# Patient Record
Sex: Female | Born: 1990 | Race: White | Hispanic: No | Marital: Married | Smoking: Never smoker
Health system: Southern US, Community
[De-identification: ages and names within clinical notes are randomized; demographics above are authoritative.]

## PROBLEM LIST (undated history)

## (undated) DIAGNOSIS — Q796 Ehlers-Danlos syndrome, unspecified: Secondary | ICD-10-CM

## (undated) DIAGNOSIS — F988 Other specified behavioral and emotional disorders with onset usually occurring in childhood and adolescence: Secondary | ICD-10-CM

## (undated) DIAGNOSIS — J45909 Unspecified asthma, uncomplicated: Secondary | ICD-10-CM

## (undated) DIAGNOSIS — Z8679 Personal history of other diseases of the circulatory system: Secondary | ICD-10-CM

## (undated) HISTORY — DX: Other specified behavioral and emotional disorders with onset usually occurring in childhood and adolescence: F98.8

## (undated) HISTORY — PX: HAND SURGERY: SHX662

## (undated) HISTORY — PX: WISDOM TOOTH EXTRACTION: SHX21

## (undated) HISTORY — DX: Personal history of other diseases of the circulatory system: Z86.79

## (undated) HISTORY — PX: WRIST SURGERY: SHX841

## (undated) HISTORY — DX: Unspecified asthma, uncomplicated: J45.909

## (undated) HISTORY — DX: Ehlers-Danlos syndrome, unspecified: Q79.60

---

## 2016-08-28 ENCOUNTER — Encounter: Payer: Self-pay | Admitting: Emergency Medicine

## 2016-08-28 ENCOUNTER — Emergency Department
Admission: EM | Admit: 2016-08-28 | Discharge: 2016-08-28 | Disposition: A | Discharge status: Home / Self Care | Payer: Managed Care, Other (non HMO) | Attending: Student in an Organized Health Care Education/Training Program | Admitting: Student in an Organized Health Care Education/Training Program

## 2016-08-28 DIAGNOSIS — W260XXA Contact with knife, initial encounter: Secondary | ICD-10-CM | POA: Diagnosis not present

## 2016-08-28 DIAGNOSIS — Y9389 Activity, other specified: Secondary | ICD-10-CM | POA: Insufficient documentation

## 2016-08-28 DIAGNOSIS — Y929 Unspecified place or not applicable: Secondary | ICD-10-CM | POA: Diagnosis not present

## 2016-08-28 DIAGNOSIS — S61011A Laceration without foreign body of right thumb without damage to nail, initial encounter: Secondary | ICD-10-CM

## 2016-08-28 DIAGNOSIS — Y999 Unspecified external cause status: Secondary | ICD-10-CM | POA: Diagnosis not present

## 2016-08-28 MED ORDER — LIDOCAINE HCL (PF) 1 % IJ SOLN
INTRAMUSCULAR | Status: AC
  Administered 2016-08-28: 5 mL
  Filled 2016-08-28: qty 5

## 2016-08-28 MED ORDER — LIDOCAINE HCL (PF) 1 % IJ SOLN
5.0000 mL | Freq: Once | INTRAMUSCULAR | Status: AC
  Administered 2016-08-28: 5 mL

## 2016-08-28 NOTE — ED Notes (Signed)

## 2016-08-28 NOTE — Discharge Instructions (Signed)
Please keep the laceration site clean. Do not submerge underwater. You may shower and get wet. You may also cleansed with half strength hydrogen peroxide daily. Avoid applying antibiotic ointment until after the sutures have been removed. Follow-up with walking clinic, PCP in 8-10 days for suture removal. If any redness warmth or drainage, return to the ER or walk-in clinic.

## 2016-08-28 NOTE — ED Triage Notes (Signed)
Pt states that she was cutting an onion this evening and cut her right thumb. Pt has an approximate 2 inch cut to the pad of the thumb. Pt is ambulatory to triage with NAD noted at this time.

## 2016-08-28 NOTE — ED Provider Notes (Signed)
[facility]-EMERGENCY DEPARTMENT Provider Note   CSN: [id number] Arrival date & time: 08/28/16  2050     History   Chief Complaint Chief Complaint  Patient presents with  . Extremity Laceration    HPI Heather Holden is a 26 y.o. female presents to the emergency department for evaluation of right thumb laceration. Patient suffered a laceration to the right thumb just prior to arrival. She cut her thumb with a kitchen knife that she was cutting onions. Her tetanus is up-to-date. She denies any numbness or tingling. Bleeding has been moderate but is currently under control. Her pain is currently moderate.  HPI  History reviewed. No pertinent past medical history.  There are no active problems to display for this patient.   Past Surgical History:  Procedure Laterality Date  . WISDOM TOOTH EXTRACTION      OB History    No data available       Home Medications    Prior to Admission medications   Not on File    Family History No family history on file.  Social History Social History  Substance Use Topics  . Smoking status: Never Smoker  . Smokeless tobacco: Never Used  . Alcohol use 1.2 oz/week    2 Glasses of wine per week     Allergies   Patient has no known allergies.   Review of Systems Review of Systems  Constitutional: Negative for activity change, chills, fatigue and fever.  HENT: Negative for congestion, sinus pressure and sore throat.   Eyes: Negative for visual disturbance.  Respiratory: Negative for cough, chest tightness and shortness of breath.   Cardiovascular: Negative for chest pain and leg swelling.  Gastrointestinal: Negative for abdominal pain, diarrhea, nausea and vomiting.  Genitourinary: Negative for dysuria.  Musculoskeletal: Negative for arthralgias and gait problem.  Skin: Positive for wound. Negative for rash.  Neurological: Negative for weakness, numbness and headaches.  Hematological: Negative for adenopathy.    Psychiatric/Behavioral: Negative for agitation, behavioral problems and confusion.     Physical Exam Updated Vital Signs BP 123/67   Pulse 75   Temp 99.3 F (37.4 C) (Oral)   Resp 18   Ht 5\' 9"  (1.753 m)   Wt 69.4 kg   LMP 08/14/2016 (Approximate)   SpO2 100%   BMI 22.59 kg/m   Physical Exam  Constitutional: She appears well-developed and well-nourished. No distress.  HENT:  Head: Normocephalic and atraumatic.  Eyes: Conjunctivae are normal.  Neck: Normal range of motion.  Cardiovascular: Normal rate.   Pulmonary/Chest: Effort normal. No respiratory distress.  Musculoskeletal:  Examination of the right thumb shows patient is able to maintain extension along the interphalangeal joint. She has full flexion. She has a volar laceration along the distal phalanx of the right thumb extending proximal to distal along the pad. There is no injury to the nail. No sign of foreign body. Sensation is intact.  Neurological: She is alert.  Skin: Skin is warm and dry.  Psychiatric: She has a normal mood and affect.  Nursing note and vitals reviewed.    ED Treatments / Results  Labs (all labs ordered are listed, but only abnormal results are displayed) Labs Reviewed - No data to display  EKG  EKG Interpretation None       Radiology No results found.  Procedures Procedures (including critical care time) LACERATION REPAIR Performed by: Patience MuscaGAINES, Aidin Doane CHRISTOPHER Authorized by: Patience MuscaGAINES, Deshonna Trnka CHRISTOPHER Consent: Verbal consent obtained. Risks and benefits: risks, benefits and alternatives were discussed Consent  given by: patient Patient identity confirmed: provided demographic data Prepped and Draped in normal sterile fashion Wound explored  Laceration Location: Right thumb  Laceration Length: 4 cm  No Foreign Bodies seen or palpated  Anesthesia: local infiltration  Local anesthetic: lidocaine 1 % without epinephrine  Anesthetic total: 3 ml  Irrigation method:  syringe Amount of cleaning: standard  Skin closure: Simple interrupted 5-0 nylon   Number of sutures: 7   Technique: Simple interrupted   Patient tolerance: Patient tolerated the procedure well with no immediate complications.  Medications Ordered in ED Medications  lidocaine (PF) (XYLOCAINE) 1 % injection 5 mL (5 mLs Infiltration Given by Other 08/28/16 2115)     Initial Impression / Assessment and Plan / ED Course  I have reviewed the triage vital signs and the nursing notes.  Pertinent labs & imaging results that were available during my care of the patient were reviewed by me and considered in my medical decision making (see chart for details).     26 year old female with laceration to the right thumb, linear with no neurological or tendon deficits noted. Wound was thoroughly irrigated and repaired with sutures. Patient will follow-up in 8-10 days for suture removal.  Final Clinical Impressions(s) / ED Diagnoses   Final diagnoses:  Laceration of right thumb without foreign body without damage to nail, initial encounter    New Prescriptions There are no discharge medications for this patient.    Evon Slack, PA-C 08/28/16 2157    Willy Eddy, MD 08/28/16 [phone]

## 2019-02-28 DIAGNOSIS — Z20828 Contact with and (suspected) exposure to other viral communicable diseases: Secondary | ICD-10-CM | POA: Diagnosis not present

## 2019-05-16 DIAGNOSIS — Z6823 Body mass index (BMI) 23.0-23.9, adult: Secondary | ICD-10-CM | POA: Diagnosis not present

## 2019-05-16 DIAGNOSIS — H11152 Pinguecula, left eye: Secondary | ICD-10-CM | POA: Diagnosis not present

## 2019-05-17 DIAGNOSIS — H15122 Nodular episcleritis, left eye: Secondary | ICD-10-CM | POA: Diagnosis not present

## 2019-05-24 DIAGNOSIS — H15122 Nodular episcleritis, left eye: Secondary | ICD-10-CM | POA: Diagnosis not present

## 2019-07-13 NOTE — L&D Delivery Note (Signed)
Delivery Note At 9:27 AM a viable female was delivered via Vaginal, Spontaneous (Presentation: Left Occiput Anterior).  APGAR: 8, 9; weight  .   Placenta status: Manual removal, Intact.  Cord: 3 vessels with the following complications: Long cord.  Anesthesia: Epidural Episiotomy: None Lacerations: 1st degree;Periurethral Suture Repair: vicryl Est. Blood Loss (mL): 300  Mom to postpartum.  Baby to Couplet care / Skin to Skin.  Lyn Henri 03/31/2020, 10:22 AM

## 2019-07-13 NOTE — L&D Delivery Note (Signed)
Vaginal Delivery Note  Progressed to complete. Pushed to delivery a viable female infant in LOA presentation. Nuchal x 2 noted and reduced mid delivery. Apgars and weight pending. Placenta intact. no complications. 1st degree perineal laceration repaired in the usual fashion as well as right periurethral tear repaired in normal fashion. No bleeding after.  Sponge, needle and instrument count correct.  EBL 300 cc.  Patient tolerated well.   Nilda Simmer MD

## 2019-09-12 DIAGNOSIS — N911 Secondary amenorrhea: Secondary | ICD-10-CM | POA: Diagnosis not present

## 2019-09-12 DIAGNOSIS — Z0183 Encounter for blood typing: Secondary | ICD-10-CM | POA: Diagnosis not present

## 2019-09-12 DIAGNOSIS — N944 Primary dysmenorrhea: Secondary | ICD-10-CM | POA: Diagnosis not present

## 2019-09-12 DIAGNOSIS — Q796 Ehlers-Danlos syndrome, unspecified: Secondary | ICD-10-CM | POA: Diagnosis not present

## 2019-09-20 DIAGNOSIS — Z31430 Encounter of female for testing for genetic disease carrier status for procreative management: Secondary | ICD-10-CM | POA: Diagnosis not present

## 2019-09-20 DIAGNOSIS — Z3480 Encounter for supervision of other normal pregnancy, unspecified trimester: Secondary | ICD-10-CM | POA: Diagnosis not present

## 2019-09-20 DIAGNOSIS — Z3685 Encounter for antenatal screening for Streptococcus B: Secondary | ICD-10-CM | POA: Diagnosis not present

## 2019-09-20 DIAGNOSIS — Z3481 Encounter for supervision of other normal pregnancy, first trimester: Secondary | ICD-10-CM | POA: Diagnosis not present

## 2019-09-20 LAB — OB RESULTS CONSOLE HIV ANTIBODY (ROUTINE TESTING): HIV: NONREACTIVE

## 2019-09-20 LAB — OB RESULTS CONSOLE GC/CHLAMYDIA
Chlamydia: NEGATIVE
Gonorrhea: NEGATIVE

## 2019-09-20 LAB — OB RESULTS CONSOLE HEPATITIS B SURFACE ANTIGEN: Hepatitis B Surface Ag: NEGATIVE

## 2019-09-20 LAB — OB RESULTS CONSOLE RUBELLA ANTIBODY, IGM: Rubella: NON-IMMUNE/NOT IMMUNE

## 2019-10-02 DIAGNOSIS — Z3401 Encounter for supervision of normal first pregnancy, first trimester: Secondary | ICD-10-CM | POA: Diagnosis not present

## 2019-10-02 DIAGNOSIS — Q796 Ehlers-Danlos syndrome, unspecified: Secondary | ICD-10-CM | POA: Diagnosis not present

## 2019-10-02 DIAGNOSIS — Z331 Pregnant state, incidental: Secondary | ICD-10-CM | POA: Diagnosis not present

## 2019-10-02 DIAGNOSIS — Z124 Encounter for screening for malignant neoplasm of cervix: Secondary | ICD-10-CM | POA: Diagnosis not present

## 2019-10-02 DIAGNOSIS — Z113 Encounter for screening for infections with a predominantly sexual mode of transmission: Secondary | ICD-10-CM | POA: Diagnosis not present

## 2019-10-09 ENCOUNTER — Other Ambulatory Visit (HOSPITAL_COMMUNITY): Payer: Self-pay | Admitting: Obstetrics and Gynecology

## 2019-10-10 DIAGNOSIS — Z36 Encounter for antenatal screening for chromosomal anomalies: Secondary | ICD-10-CM | POA: Diagnosis not present

## 2019-10-10 DIAGNOSIS — Z3A12 12 weeks gestation of pregnancy: Secondary | ICD-10-CM | POA: Diagnosis not present

## 2019-10-10 DIAGNOSIS — Z3682 Encounter for antenatal screening for nuchal translucency: Secondary | ICD-10-CM | POA: Diagnosis not present

## 2019-10-18 DIAGNOSIS — R87611 Atypical squamous cells cannot exclude high grade squamous intraepithelial lesion on cytologic smear of cervix (ASC-H): Secondary | ICD-10-CM | POA: Diagnosis not present

## 2019-10-18 DIAGNOSIS — N871 Moderate cervical dysplasia: Secondary | ICD-10-CM | POA: Diagnosis not present

## 2019-10-29 ENCOUNTER — Encounter (HOSPITAL_COMMUNITY): Payer: Self-pay | Admitting: *Deleted

## 2019-10-30 ENCOUNTER — Ambulatory Visit (HOSPITAL_BASED_OUTPATIENT_CLINIC_OR_DEPARTMENT_OTHER): Payer: BC Managed Care – PPO | Admitting: Genetic Counselor

## 2019-10-30 ENCOUNTER — Other Ambulatory Visit: Payer: Self-pay

## 2019-10-30 ENCOUNTER — Ambulatory Visit (HOSPITAL_COMMUNITY): Payer: BC Managed Care – PPO | Attending: Obstetrics and Gynecology | Admitting: Obstetrics

## 2019-10-30 ENCOUNTER — Ambulatory Visit (HOSPITAL_COMMUNITY): Payer: BC Managed Care – PPO | Admitting: *Deleted

## 2019-10-30 ENCOUNTER — Encounter (HOSPITAL_COMMUNITY): Payer: Self-pay

## 2019-10-30 ENCOUNTER — Ambulatory Visit (HOSPITAL_COMMUNITY): Payer: Self-pay | Admitting: Genetic Counselor

## 2019-10-30 ENCOUNTER — Other Ambulatory Visit (HOSPITAL_COMMUNITY): Payer: Self-pay | Admitting: *Deleted

## 2019-10-30 VITALS — BP 112/69 | HR 55 | Temp 97.6°F | Wt 165.0 lb

## 2019-10-30 DIAGNOSIS — Z8269 Family history of other diseases of the musculoskeletal system and connective tissue: Secondary | ICD-10-CM | POA: Diagnosis not present

## 2019-10-30 DIAGNOSIS — Q7962 Hypermobile Ehlers-Danlos syndrome: Secondary | ICD-10-CM

## 2019-10-30 DIAGNOSIS — Z362 Encounter for other antenatal screening follow-up: Secondary | ICD-10-CM

## 2019-10-30 DIAGNOSIS — O358XX0 Maternal care for other (suspected) fetal abnormality and damage, not applicable or unspecified: Secondary | ICD-10-CM

## 2019-10-30 DIAGNOSIS — Z3A15 15 weeks gestation of pregnancy: Secondary | ICD-10-CM | POA: Diagnosis not present

## 2019-10-30 DIAGNOSIS — Z315 Encounter for genetic counseling: Secondary | ICD-10-CM

## 2019-10-30 DIAGNOSIS — K005 Hereditary disturbances in tooth structure, not elsewhere classified: Secondary | ICD-10-CM

## 2019-10-30 NOTE — Progress Notes (Signed)
10/30/2019  Heather Holden Dec 09, 1990 MRN: [id number] DOV: 10/30/2019  Heather Holden presented to the [facility] for Maternal Fetal Care for a genetics consultation regarding her family history of Ehlers-Danlos syndrome. Heather Holden came to her appointment alone due to COVID-19 visitor restrictions.   Indication for genetic counseling - Family history of Ehlers Danlos syndrome  Prenatal history  Heather Holden is a [id number], 29 y.o. female. Her current pregnancy has completed [redacted]w[redacted]d(Estimated Date of Delivery: 04/21/20).  Heather Holden exposure to environmental toxins or chemical agents. She denied the use of alcohol, tobacco or street drugs. She reported taking prenatal vitamins. She denied significant viral illnesses, fevers, and bleeding during the course of her pregnancy. Her medical and surgical histories were noncontributory.  Family History  A three generation pedigree was drafted and reviewed. The family history is remarkable for the following:  - Heather Holden is adopted, but recently reconnected with her biological mother and learned that her mother and two maternal half sisters have hypermobile Ehlers-Danlos syndrome (EDS). Her mother has an aortic aneurysm that is being carefully monitored and had postpartum hemorrhages following all three of her pregnancies. Heather Holden not entirely sure about which specific features of EDS her half sisters have. Heather Holden. DMcbryarherself has tall stature, hyperflexibility, and attention deficit disorder, but reported no other features of EDS. See Discussion section for more details.   - One of Heather Holden half sisters has autism in addition to EDS. Autism can be isolated, multifactorial, or part of a genetic syndrome; however, underlying genetic conditions account for less than 10% of autism diagnoses. Recurrence risk for first-degree relatives of individuals with idiopathic autism is estimated to be between 2-8%. Given that Heather Holden sister is a third-degree  relative to her fetus, the risk of recurrence for autism in the current fetus is likely not greatly elevated above the general population risk of ~1 in 515 or 1.5%.  The remaining family histories were reviewed and found to be noncontributory for birth defects, intellectual disability, recurrent pregnancy loss, and known genetic conditions.    The patient's ethnicity is [place] The father of the pregnancy's ethnicity is [place] Ashkenazi Jewish ancestry and consanguinity were denied. Pedigree will be scanned under Media.  Discussion  Heather Holden was referred for genetic counseling due to her family history of Ehlers Danlos syndrome (EDS). EDS is the term for a group of connective tissue disorders that affect the skin, bones, blood vessels, and other organs/tissues throughout the body. According to the 2017 international classification of EDS, there are 13 distinct subtypes of EDS, including classical EDS, classical-like EDS, cardiac-valvular EDS, vascular EDS, hypermobile EDS, arthrochalasia EDS, dermatosparaxis EDS, kyphoscoliotic EDS, brittle cornea syndrome, spondylodysplastic EDS, musculocontractural EDS, myopathic EDS, and periodontal EDS. EDS affects at least 1 in every 5000 individuals. The hypermobile and classical forms of the condition are most common.   Individuals with EDS often have certain features in common, such as joint hypermobility, hypotonia, delayed motor skills, joint dislocations, chronic pain, skin abnormalities, easy bruising, and abnormal scarring. Each subtype is associated with additional distinct features. Hypermobile EDS (hEDS) has a complex set of clinical diagnostic criteria. These criteria include joint hypermobility, signs of faulty connective tissue throughout the body (e.g. skin features, hernias, prolapses), a family history of the condition, and musculoskeletal problems (e.g. long-term pain, dislocations). There are many  associated symptoms that are not part of the formal criteria for hEDS, such as postural orthostatic tachycardia  syndrome (POTS), digestive disorders, pelvic and bladder dysfunction, and anxiety disorders. Many individuals do not fully meet diagnostic criteria for hEDS but experience hypermobility that interferes with their life in some way. After other possible conditions are excluded, a diagnosis of generalized hypermobility spectrum disorder may be made for these individuals.   EDS is associated with at least 20 different genes; however, there has not yet been a genetic cause identified for hEDS. Many forms of EDS, including hEDS, are inherited in an autosomal dominant fashion. Since Heather Holden mother reportedly has hEDS, this means that Heather Holden herself has a 1 in 2 (50%) chance of having the condition. Heather Holden has not had an evaluation by a medical geneticist for hEDS. Given her limited symptoms, it is unlikely that she would meet clinical diagnostic criteria for the condition. However, Heather Holden could be referred to an adult genetics clinic to definitively determine whether or not she meets hEDS clinical criteria. If Heather Holden were diagnosed with hEDS, she would havea 1 in 2 (50%)chance of having a child with hEDS. This would be true for each of her pregnancies. I offered Heather Holden. Meloy a referral to an adult genetics clinic for a clinical EDS work-up, which she declined at this time.  Heather Holden. Grzesiak mother had genetic testing performed via a connective tissue disorder panel from [facility] in 2017. This panel sequenced 32 genes associated with syndromic connective tissue disorders and/or familial thoracic aortic aneurysms and aortic dissections. Upon review of her genetic testing report (sent to me with her consent), Heather Holden. Rybacki mother had variants of uncertain significance (VUS) in four genes: NOTCH1, COL1A2, COL11A1, and SLC2A10. She did not carry any pathogenic variants in the connective  tissue/aortic aneurysm susceptibility genes analyzed. Heather Holden. Hernandez and I discussed these findings. A VUS indicates that there is not enough evidence to determine whether a change in a gene is a benign variant or if it contributes to causing a connective tissue disease. Since the time that Heather Holden. Rabold's mother underwent testing, several other genetic testing laboratories have reclassified the identified variants in COL1A2 and COL11A1 as benign or likely benign. Neither NOTCH1 nor SLC2A10 are known to be associated with EDS. Thus, Heather Holden. Martorana mother's genetic testing results likely do not explain her history of hEDS.   Heather Holden. Vondra and I reviewed screening she had for conditions other than EDS during the current pregnancy. She reportedly had negative first trimester screening for trisomies 55, 71, and 25. This testing report was not available for my review. Heather Holden. Blackston also had negative carrier screening for cystic fibrosis (CF) and spinal muscular atrophy (SMA) as well as a negative hemoglobin electrophoresis. This significantly decreases her chances of being a carrier for CF, SMA, and hemoglobinopathies and thus her chances of having a child affected by one of these conditions.  Heather Holden. Jollie was also counseled regarding diagnostic testing via amniocentesis. We discussed the technical aspects of the procedure and quoted up to a 1 in 500 (0.2%) risk for spontaneous pregnancy loss or other adverse pregnancy outcomes as a result of amniocentesis. Cultured cells from an amniocentesis sample allow for the visualization of a fetal karyotype, which can detect >99% of chromosomal aberrations. Chromosomal microarray can also be performed to identify smaller deletions or duplications of fetal chromosomal material. Amniocentesis would not be able to assess for hEDS in the fetus given that there is no known genetic cause for hEDS at this time. Additionally, testing the fetus for connective tissue disorders via a multigene panel would  likely be uninformative given that Heather Holden. Heiser has not had genetic testing for connective tissue disorders herself. After careful consideration, Heather Holden. Beeck declined amniocentesis at this time. She understands that amniocentesis is available at any point after 16 weeks of pregnancy and that she may opt to undergo the procedure at a later date should she change her mind.  Additional screening and diagnostic testing were declined today. She wanted to discuss the option of an adult genetics referral with her partner and family members and make her decision about pursuing a referral later. Given that it is unknown if Heather Holden. Reisner has hEDS, her pregnancy will be followed closely by Maternal Fetal Medicine based on her family history of the condition. Heather Holden. Oshea had a consultation with Dr. Annamaria Boots today following our genetic counseling session to discuss expectant management and recommendations for the remainder of her pregnancy in detail.  I counseled Heather Holden. Persaud regarding the above risks and available options.The approximate face-to-face time with the genetic counselor was 40 minutes.  In summary:  Reviewed family history concerns  Biological mother and two maternal half sisters have hypermobile Ehlers-Danlos syndrome (hEDS). Mother had genetic testing for connective tissue disorders which was uninformative  50% chance she could have hEDS, though she may not meet clinical diagnostic criteria given her limited symptoms  Offered referral to adult genetics which she declined at this time. She will contact me if interested in pursuing this after discussing with family  Reviewed screening that has previously been performed in pregnancy  Reportedly negative first trimester screening (results not available to me)  Negative carrier screening for CF, SMA, and hemoglobinopathies  Offered additional testing and screening  Declined amniocentesis  Pregnancy will continue to be followed by MFM given family history of  [place], Heather Holden, Counselling psychologist

## 2019-11-01 NOTE — Progress Notes (Signed)
MFM Note  Heather Holden was seen in consultation today due to a family history of the hypermobility type of Ehlers-Danlos syndrome.  She is a 29 year old gravida 2 para 0 with an EDC of April 21, 2020 who is currently at 15 weeks and 1 day. Heather Holden reports that she was adopted as an infant.  She found out when she was 52 or 29 years old that her biological mother and half-sisters were all diagnosed with the hypermobility type of Ehlers-Danlos syndrome (EDS).  Other than being extremely flexible, she denies any other signs or symptoms related to Ehlers-Danlos syndrome.  She has not had any genetic testing to confirm that she has the Ehlers-Danlos syndrome and has not received an official diagnosis of EDS based on the clinical criteria.  She denies having any cardiac issues related to EDS.  She reports that her biological mother experienced postpartum hemorrhages with all her deliveries.  The patient reports that she had a first trimester screening test for fetal aneuploidy in her current pregnancy that indicated a low risk.  Obstetrical history includes an elective termination of pregnancy.  Medical history-denies any significant past medical history  Surgical history includes wisdom teeth removal and orthopedic surgery on her left hand.  She did not experience any complications with anesthesia during these surgeries.  Current medications-prenatal vitamins.  The patient was advised that hypermobile Ehlers-Danlos syndrome is one of the most common types of EDS. Pregnancy is generally well-tolerated in women with the hypermobile type of EDS and most women have good outcomes.  The reported pregnancy related complications in women with the hypermobile  type of EDS include preterm labor, preterm premature rupture of membranes, poor wound healing, and postpartum hemorrhage.  She was reassured that each of these complications can be managed during pregnancy should she develop any of these  complications.  Due to the increased risk of preterm labor in women with the hypermobile type of EDS, we will follow her with serial cervical length measurements starting at between 16 to 17 weeks and we will continue these measurements until about 24 weeks.  She was advised that should progressive cervical shortening be noted, that a cervical cerclage may be indicated.    We will continue to follow her closely with you with serial ultrasound exams.  A cervical length measurement was scheduled for her at around 17 weeks and a fetal anatomy scan has been scheduled for her at around 20 weeks in our office.    Due to the potential for aortic aneurysm/dissection in women with Ehlers-Danlos syndrome, she should have a maternal echocardiogram performed with cardiology sometime in the second trimester to screen for any maternal cardiac conditions.  She was reassured that aortic aneurysms generally occur in women with the vascular type of EDS.  Therefore, her risk of having a cardiac condition related to EDS should be low.  As the duration of analgesia from local anesthetics can be significantly reduced in patients with the hypermobile type of EDS, she should have an anesthesia consult when she is admitted to Labor and Delivery.  As she reports that she did not have any complications related to anesthesia during her two prior surgeries, I do not anticipate that this will be major issue for her.  Due to the potential for postpartum hemorrhage in women with the hypermobile type of EDS, preparations should be for the management of hemorrhage after her delivery with the appropriate administration of uterotonic agents such as Pitocin and Cytotec.  The patient also had  a consultation today with our genetic counselor to discuss the genetic transmission of Ehlers-Danlos syndrome to her child.  Please refer to the genetic counselor's report for this information.  The patient was reassured that based on my past  experience with women who have the hypermobile type of EDS in pregnancy and the fact that it is not even certain if she has Ehlers-Danlos syndrome, I anticipate that she will have a good pregnancy outcome.  At the end of the consultation, the patient stated that all her questions had been answered to her complete satisfaction.    Thank you for referring this patient for Maternal-Fetal Medicine consultation.  A total of 40 minutes was spent counseling and coordinating the care for this patient.  Greater than 50% of the time was spent in direct face-to-face contact.  Recommendations:  Serial cervical length measurements to start at around 16 to 17 weeks   Detailed fetal anatomy scan at around 20 weeks  Maternal echocardiogram to assess for any cardiac lesions   Anesthesia consult when she is admitted for delivery  Appropriate uterotonic's should be given after delivery  Vaginal delivery may be attempted

## 2019-11-02 ENCOUNTER — Telehealth: Payer: Self-pay

## 2019-11-02 NOTE — Telephone Encounter (Signed)
NOTES ON FILE FROM ELISE LEGER [phone], SENT REFERRAL TO SCHEDULING °

## 2019-11-05 ENCOUNTER — Encounter (HOSPITAL_COMMUNITY): Payer: Self-pay | Admitting: Obstetrics and Gynecology

## 2019-11-05 ENCOUNTER — Other Ambulatory Visit (HOSPITAL_COMMUNITY): Payer: Self-pay | Admitting: Obstetrics and Gynecology

## 2019-11-15 ENCOUNTER — Ambulatory Visit (HOSPITAL_COMMUNITY): Payer: BC Managed Care – PPO | Attending: Obstetrics and Gynecology

## 2019-11-15 ENCOUNTER — Other Ambulatory Visit: Payer: Self-pay

## 2019-11-15 ENCOUNTER — Ambulatory Visit: Payer: BC Managed Care – PPO | Admitting: *Deleted

## 2019-11-15 VITALS — BP 116/60 | HR 62 | Temp 97.8°F

## 2019-11-15 DIAGNOSIS — Q7962 Hypermobile Ehlers-Danlos syndrome: Secondary | ICD-10-CM | POA: Diagnosis not present

## 2019-11-15 DIAGNOSIS — O322XX0 Maternal care for transverse and oblique lie, not applicable or unspecified: Secondary | ICD-10-CM

## 2019-11-15 DIAGNOSIS — O0990 Supervision of high risk pregnancy, unspecified, unspecified trimester: Secondary | ICD-10-CM

## 2019-11-15 DIAGNOSIS — Z3686 Encounter for antenatal screening for cervical length: Secondary | ICD-10-CM

## 2019-11-15 DIAGNOSIS — Z8489 Family history of other specified conditions: Secondary | ICD-10-CM

## 2019-11-15 DIAGNOSIS — Z3A17 17 weeks gestation of pregnancy: Secondary | ICD-10-CM

## 2019-11-15 IMAGING — US US MFM OB TRANSVAGINAL
1 series · 15 of 22 positions shown · non-contrast
Comparison: none

[Series 1: us mfm ob transvaginal · 22 acquisitions, 15 frames shown]
[im 1/22]
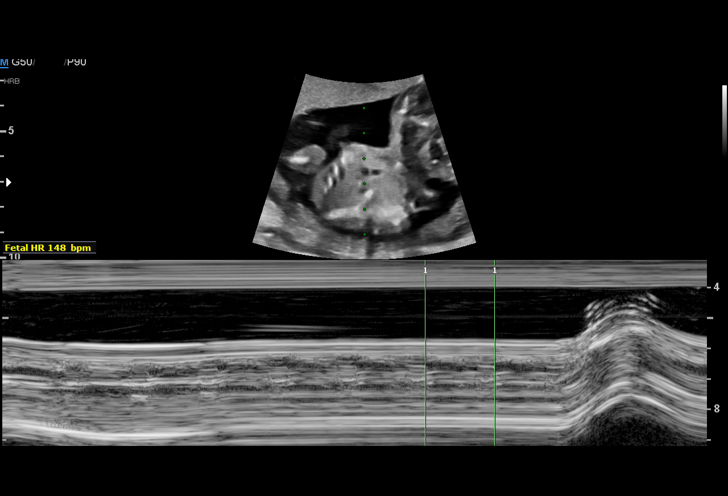
[im 3/22]
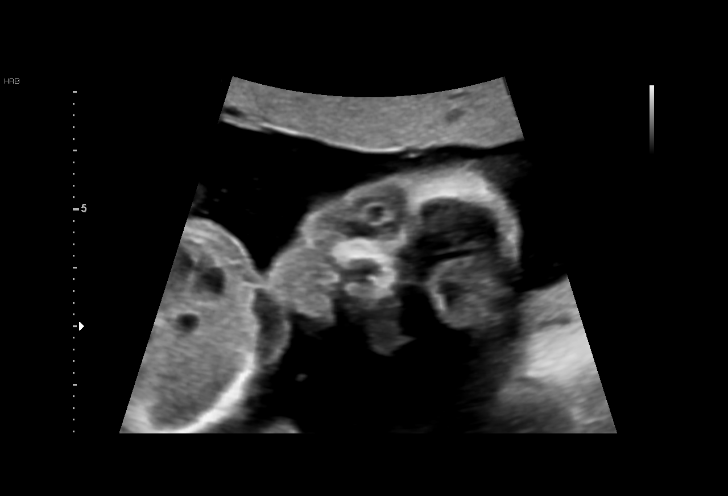
[im 4/22]
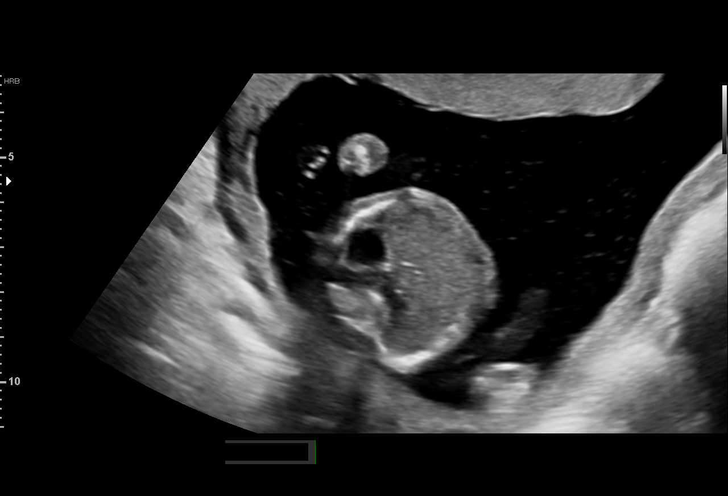
[im 6/22]
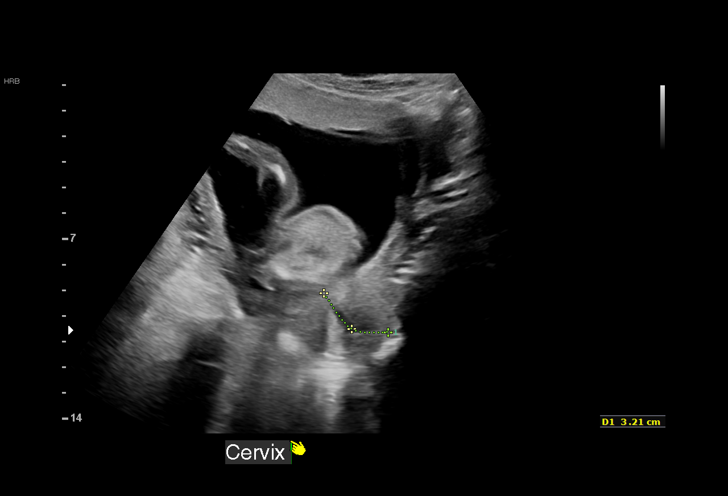
[im 7/22]
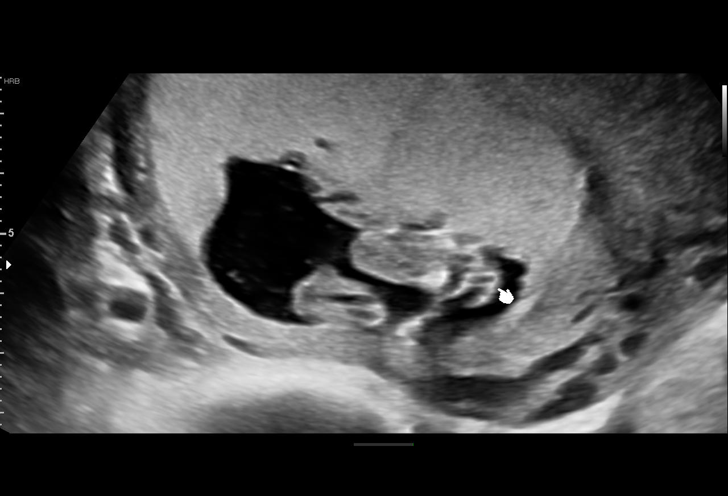
[im 9/22]
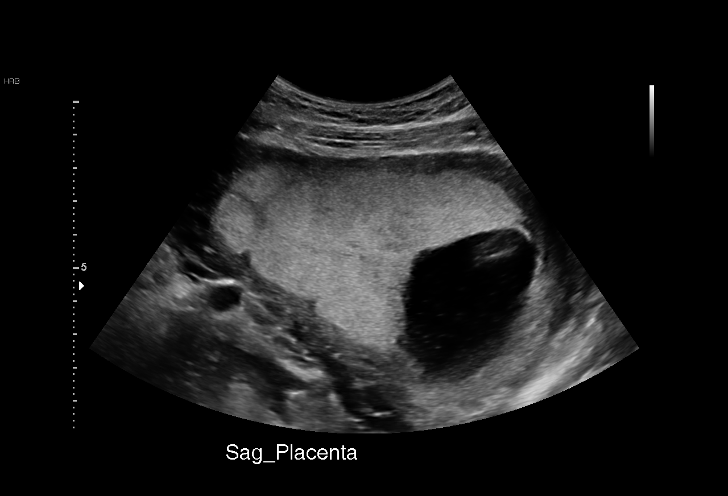
[im 10/22]
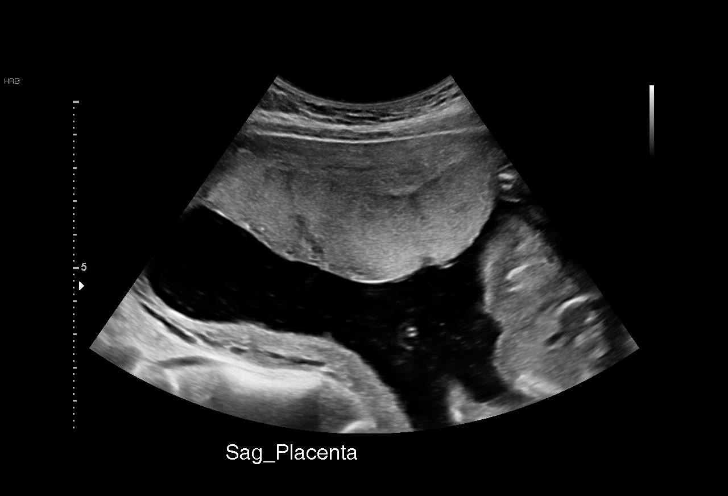
[im 12/22]
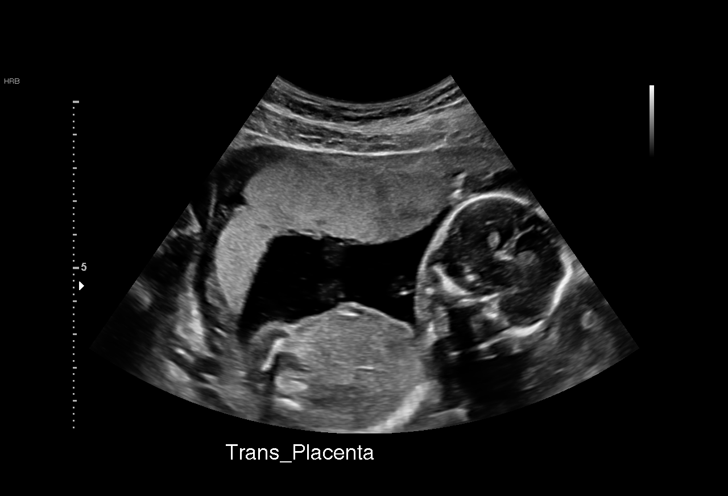
[im 13/22]
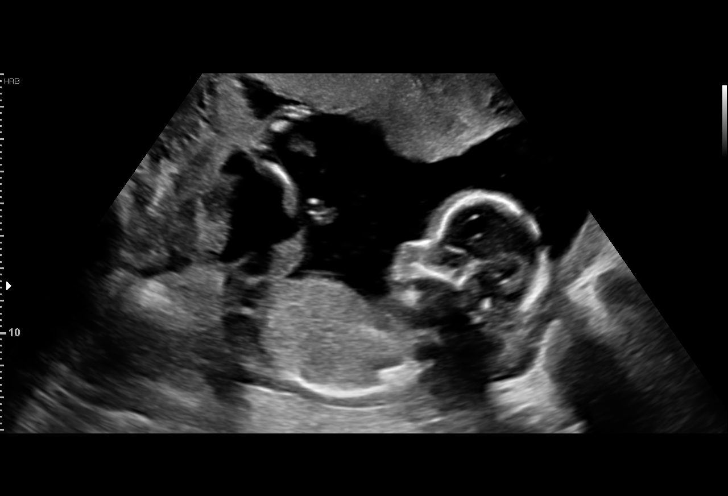
[im 14/22]
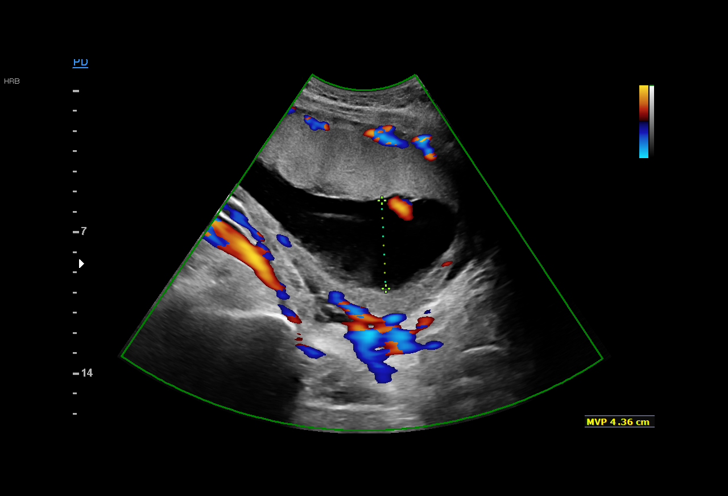
[im 16/22]
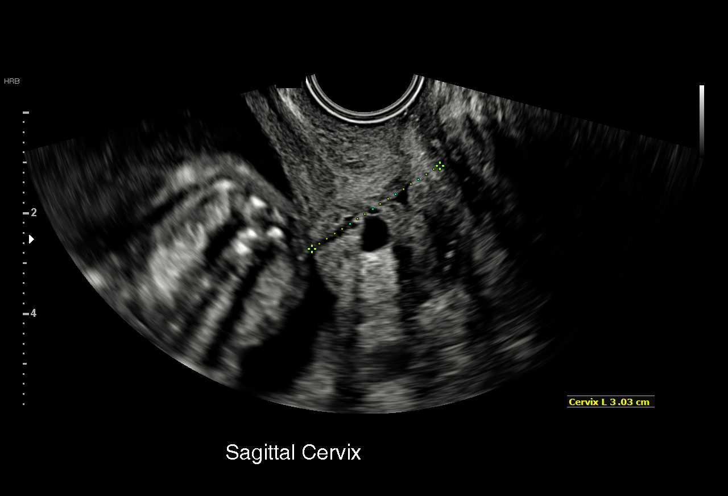
[im 17/22]
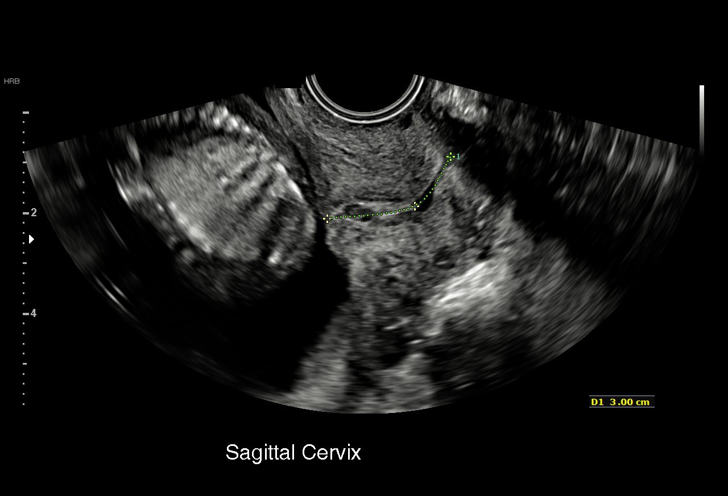
[im 19/22]
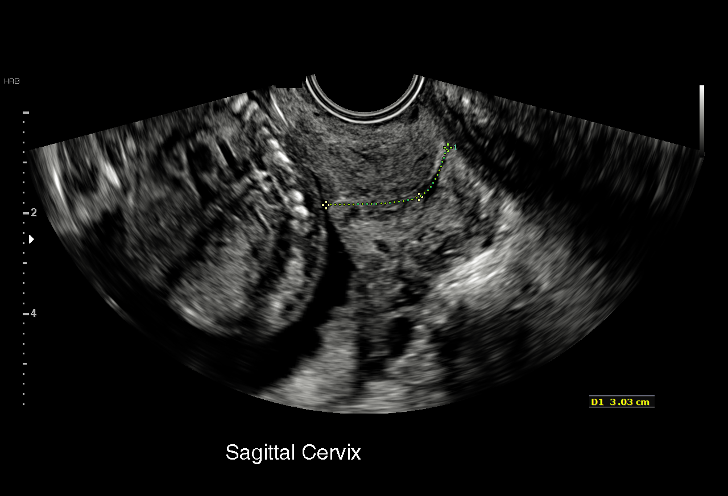
[im 20/22]
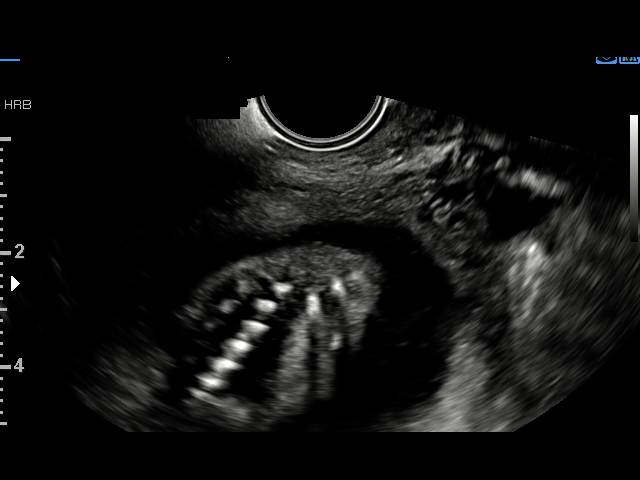
[im 22/22]
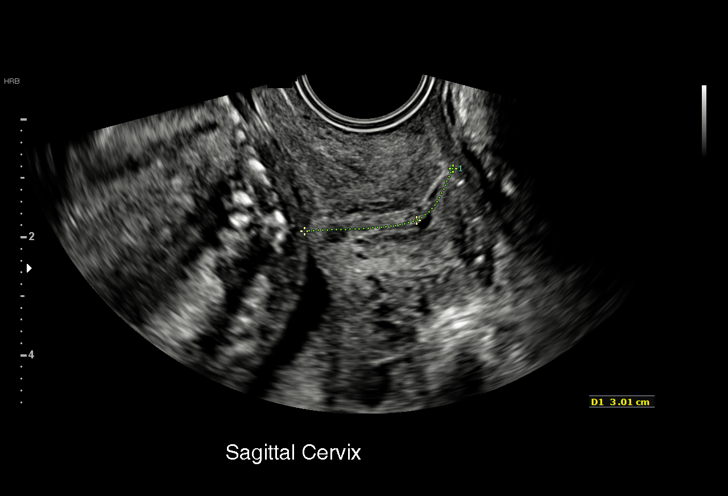

[15 of 22 positions shown; findings below may reference images not displayed]

Indications

 Family history of genetic disorder (Laurence
 Sidorov syndrome) pt beleives that she also
 has EDS but has not had any genetic testing to
 confirm
 Encounter for cervical length
 17 weeks gestation of pregnancy
 Low risk first trimester screen per patient - not
 in chart
Vital Signs

 BMI:
Fetal Evaluation

 Num Of Fetuses:          1
 Fetal Heart Rate(bpm):   148
 Cardiac Activity:        Observed
 Presentation:            Transverse, head to maternal left
 Placenta:                Anterior

 Amniotic Fluid
 AFI FV:      Within normal limits

                             Largest Pocket(cm)

OB History

 Gravidity:     2
 TOP:           1        Living: 0
Gestational Age

 LMP:            17w 3d       Date:  07/16/19                   EDD:  04/21/20
 Best:           17w 3d    Det. By:  LMP  (07/16/19)            EDD:  04/21/20
Anatomy

 Stomach:                Appears normal, left   Bladder:                Appears normal
                         sided
Cervix Uterus Adnexa

 Cervix
 Length:            3.03  cm.
 Normal appearance by transvaginal scan
Impression

 Limited exam to evaluate cervical length given Ms. Jerabkova
 diagnosis of Klpigbb [place]
 Normal cervical length
 Low risk NIIPS

 Ms. Delonte expressed that she plans to receive ongoing care at
 your offices. I discussed the recommendations as noted below.
Recommendations

 Continue serial CL every 2 weeks
 Anatomy at 18-20 weeks gestation.

 These studies have not been scheduled at our offices as Ms.
 Delonte plans to have them performed at your offices.

 Thank you for the referral.

## 2019-11-18 NOTE — Progress Notes (Signed)
Cardiology Office Note:   Date:  11/19/2019  NAME:  Heather Holden    MRN: [id number] DOB:  1990/09/18   PCP:  Tyson Dense, MD  Cardiologist:  No primary care provider on file.   Referring MD: Tyson Dense, *   Chief Complaint  Patient presents with  . Palpitations   History of Present Illness:   Heather Holden is a 29 y.o. female with a hx of pregnancy, Ehlers-Danlos/POTS who is being seen today for the evaluation of tachycardia at the request of Tyson Dense, *.  She presents for the evaluation of aortic aneurysm screening in setting of Ehlers-Danlos and pregnancy.  She is currently 18 weeks.  No issues with pregnancy so far.  She was diagnosed with Ehlers-Danlos hypermobile form some years ago.  Her mother has a history of a thoracic aortic aneurysm.  She is not had it repaired.  There is no history of sudden cardiac death or aortic dissection in the family from what I can tell.  She does not know the details of her mother's diagnosis.  She reports no symptoms such as chest pain, palpitations or shortness of breath.  She does work as a Loss adjuster, chartered.  She does work routinely still.  No issues with work.  She does report that she was diagnosed with POTS and treats this conservatively.  She stays well-hydrated but it appears prolonged standing or changing position as well as heat such as the shower will exacerbate her symptoms.  No recent symptoms.  She seems to be maintaining adequate hydration of pregnancy up to 96 ounces of water per day.  She is otherwise without symptoms.  She is a never smoker.  Surgical history is for minor traumas.  Family history is really only significant for thoracic aneurysm in mother.  Problem List 1. Ehlers-Danlos, hypermobile form  2. POTS   Past Medical History: Past Medical History:  Diagnosis Date  . ADD (attention deficit disorder)   . Asthma    Childhood  . Ehlers-Danlos disease   . History of cardiac arrhythmia     Not actually diagnosed    Past Surgical History: Past Surgical History:  Procedure Laterality Date  . HAND SURGERY    . WISDOM TOOTH EXTRACTION    . WRIST SURGERY      Current Medications: Current Meds  Medication Sig  . Prenatal Vit-Fe Fumarate-FA (PRENATAL VITAMINS PO) Take by mouth.     Allergies:    Patient has no known allergies.   Social History: Social History   Socioeconomic History  . Marital status: Married    Spouse name: Not on file  . Number of children: Not on file  . Years of education: Not on file  . Highest education level: Not on file  Occupational History  . Not on file  Tobacco Use  . Smoking status: Never Smoker  . Smokeless tobacco: Never Used  Substance and Sexual Activity  . Alcohol use: Not Currently  . Drug use: No  . Sexual activity: Not on file  Other Topics Concern  . Not on file  Social History Narrative  . Not on file   Social Determinants of Health   Financial Resource Strain:   . Difficulty of Paying Living Expenses:   Food Insecurity:   . Worried About Charity fundraiser in the Last Year:   . Arboriculturist in the Last Year:   Transportation Needs:   . Film/video editor (Medical):   Marland Kitchen  Lack of Transportation (Non-Medical):   Physical Activity:   . Days of Exercise per Week:   . Minutes of Exercise per Session:   Stress:   . Feeling of Stress :   Social Connections:   . Frequency of Communication with Friends and Family:   . Frequency of Social Gatherings with Friends and Family:   . Attends Religious Services:   . Active Member of Clubs or Organizations:   . Attends Banker Meetings:   Marland Kitchen Marital Status:     Family History: The patient's family history includes Heart disease in her mother. She was adopted.  ROS:   All other ROS reviewed and negative. Pertinent positives noted in the HPI.     EKGs/Labs/Other Studies Reviewed:   The following studies were personally reviewed by me today:   EKG:  EKG is ordered today.  The ekg ordered today demonstrates sinus bradycardia, heart rate 53, no acute ST-T changes, no evidence of prior infarction, and was personally reviewed by me.   Recent Labs: No results found for requested labs within last 8760 hours.   Recent Lipid Panel No results found for: CHOL, TRIG, HDL, CHOLHDL, VLDL, LDLCALC, LDLDIRECT  Physical Exam:   VS:  BP (!) 138/58   Pulse (!) 53   Ht 5\' 9"  (1.753 m)   Wt 175 lb (79.4 kg)   LMP 07/16/2019   BMI 25.84 kg/m    Wt Readings from Last 3 Encounters:  11/19/19 175 lb (79.4 kg)  10/30/19 165 lb (74.8 kg)  08/28/16 153 lb (69.4 kg)    General: Well nourished, well developed, in no acute distress Heart: Atraumatic, normal size  Eyes: PEERLA, EOMI  Neck: Supple, no JVD Endocrine: No thryomegaly Cardiac: Normal S1, S2; RRR; no murmurs, rubs, or gallops Lungs: Clear to auscultation bilaterally, no wheezing, rhonchi or rales  Abd: Soft, nontender, no hepatomegaly  Ext: No edema, pulses 2+ Musculoskeletal: No deformities, BUE and BLE strength normal and equal Skin: Warm and dry, no rashes   Neuro: Alert and oriented to person, place, time, and situation, CNII-XII grossly intact, no focal deficits  Psych: Normal mood and affect   ASSESSMENT:   Heather Holden is a 29 y.o. female who presents for the following: 1. Tachycardia   2. Family history of heart disease   3. Ehlers-Danlos disease     PLAN:   1. Tachycardia 2. Family history of heart disease 3. Ehlers-Danlos disease -She is currently [redacted] weeks pregnant.  Her mother has a history of aortic aneurysm that has not been repaired.  No strong family history of aortic dissection or sudden death to suggest significant aortic pathology.  She also has no murmurs on examination and her overall exam is benign.  We will proceed with an echocardiogram to look at her aortic root.  I did inquire about Marfan's as a diagnosis.  She does not look marfanoid on exam today.   She does have along arms but her digits are not extremely long.  She has no pectus excavatum.  I did counsel her that Ehlers-Danlos is less likely to be associated with aortic pathology.  I am intrigued by her mother's diagnosis.  We will see what her aorta looks like on echocardiogram to determine if any other screening is needed.  I have also advised adequate hydration in the setting of POTS.  Pregnancy could worsen her symptoms.  She appears to be doing well for now.  We will plan to see her back in  8 months.  She will deliver in October and we can discuss if any further screening will be needed after pregnancy.  This assumes that her echocardiogram will be normal.  Clearly we will see her sooner if we need to based on the results of her echocardiogram.   Disposition: Return in about 8 months (around 07/21/2020).  Medication Adjustments/Labs and Tests Ordered: Current medicines are reviewed at length with the patient today.  Concerns regarding medicines are outlined above.  Orders Placed This Encounter  Procedures  . EKG 12-Lead  . ECHOCARDIOGRAM COMPLETE   No orders of the defined types were placed in this encounter.   Patient Instructions  Medication Instructions:  The current medical regimen is effective;  continue present plan and medications.  *If you need a refill on your cardiac medications before your next appointment, please call your pharmacy*   Testing/Procedures: Echocardiogram - Your physician has requested that you have an echocardiogram. Echocardiography is a painless test that uses sound waves to create images of your heart. It provides your doctor with information about the size and shape of your heart and how well your heart's chambers and valves are working. This procedure takes approximately one hour. There are no restrictions for this procedure. This will be performed at our [facility] location - [address].    Follow-Up: At [facility], you and  your health needs are our priority.  As part of our continuing mission to provide you with exceptional heart care, we have created designated Provider Care Teams.  These Care Teams include your primary Cardiologist (physician) and Advanced Practice Providers (APPs -  Physician Assistants and Nurse Practitioners) who all work together to provide you with the care you need, when you need it.  We recommend signing up for the patient portal called "MyChart".  Sign up information is provided on this After Visit Summary.  MyChart is used to connect with patients for Virtual Visits (Telemedicine).  Patients are able to view lab/test results, encounter notes, upcoming appointments, etc.  Non-urgent messages can be sent to your provider as well.   To learn more about what you can do with MyChart, go to [email or web address].    Your next appointment:   8 month(s)  The format for your next appointment:   In Person  Provider:   Lennie Odor, MD   Other Instructions Bring mothers history back with you at next visit.     Signed, Lenna Gilford. Flora Lipps, MD [facility]  [address] [place], [place] [address] [phone]  11/19/2019 8:53 AM

## 2019-11-19 ENCOUNTER — Ambulatory Visit (INDEPENDENT_AMBULATORY_CARE_PROVIDER_SITE_OTHER): Payer: BC Managed Care – PPO | Admitting: Cardiovascular Disease

## 2019-11-19 ENCOUNTER — Encounter: Payer: Self-pay | Admitting: Cardiovascular Disease

## 2019-11-19 ENCOUNTER — Other Ambulatory Visit: Payer: Self-pay

## 2019-11-19 VITALS — BP 138/58 | HR 53 | Ht 69.0 in | Wt 175.0 lb

## 2019-11-19 DIAGNOSIS — Z8249 Family history of ischemic heart disease and other diseases of the circulatory system: Secondary | ICD-10-CM

## 2019-11-19 DIAGNOSIS — R000 Tachycardia, unspecified: Secondary | ICD-10-CM | POA: Diagnosis not present

## 2019-11-19 DIAGNOSIS — Q796 Ehlers-Danlos syndrome, unspecified: Secondary | ICD-10-CM | POA: Diagnosis not present

## 2019-11-19 DIAGNOSIS — R002 Palpitations: Secondary | ICD-10-CM

## 2019-11-19 NOTE — Patient Instructions (Signed)
Medication Instructions:  The current medical regimen is effective;  continue present plan and medications.  *If you need a refill on your cardiac medications before your next appointment, please call your pharmacy*   Testing/Procedures: Echocardiogram - Your physician has requested that you have an echocardiogram. Echocardiography is a painless test that uses sound waves to create images of your heart. It provides your doctor with information about the size and shape of your heart and how well your heart's chambers and valves are working. This procedure takes approximately one hour. There are no restrictions for this procedure. This will be performed at our [facility] location - [address].    Follow-Up: At [facility], you and your health needs are our priority.  As part of our continuing mission to provide you with exceptional heart care, we have created designated Provider Care Teams.  These Care Teams include your primary Cardiologist (physician) and Advanced Practice Providers (APPs -  Physician Assistants and Nurse Practitioners) who all work together to provide you with the care you need, when you need it.  We recommend signing up for the patient portal called "MyChart".  Sign up information is provided on this After Visit Summary.  MyChart is used to connect with patients for Virtual Visits (Telemedicine).  Patients are able to view lab/test results, encounter notes, upcoming appointments, etc.  Non-urgent messages can be sent to your provider as well.   To learn more about what you can do with MyChart, go to [email or web address].    Your next appointment:   8 month(s)  The format for your next appointment:   In Person  Provider:   Lennie Odor, MD   Other Instructions Bring mothers history back with you at next visit.

## 2019-11-23 DIAGNOSIS — Z3A18 18 weeks gestation of pregnancy: Secondary | ICD-10-CM | POA: Diagnosis not present

## 2019-11-23 DIAGNOSIS — Z363 Encounter for antenatal screening for malformations: Secondary | ICD-10-CM | POA: Diagnosis not present

## 2019-12-03 ENCOUNTER — Ambulatory Visit: Payer: BC Managed Care – PPO

## 2019-12-17 ENCOUNTER — Ambulatory Visit (HOSPITAL_COMMUNITY): Payer: BC Managed Care – PPO | Attending: Cardiovascular Disease

## 2019-12-17 ENCOUNTER — Other Ambulatory Visit: Payer: Self-pay

## 2019-12-17 DIAGNOSIS — R000 Tachycardia, unspecified: Secondary | ICD-10-CM | POA: Diagnosis not present

## 2019-12-17 NOTE — Progress Notes (Signed)
Patient ID: Heather Holden, female   DOB: [birth year], 29 y.o.   MRN: [id number]  Unable to administer Definity due to pregnancy.

## 2019-12-31 DIAGNOSIS — Z3A24 24 weeks gestation of pregnancy: Secondary | ICD-10-CM | POA: Diagnosis not present

## 2019-12-31 DIAGNOSIS — Q796 Ehlers-Danlos syndrome, unspecified: Secondary | ICD-10-CM | POA: Diagnosis not present

## 2020-01-15 DIAGNOSIS — O360912 Maternal care for other rhesus isoimmunization, first trimester, fetus 2: Secondary | ICD-10-CM | POA: Diagnosis not present

## 2020-01-15 DIAGNOSIS — Z3480 Encounter for supervision of other normal pregnancy, unspecified trimester: Secondary | ICD-10-CM | POA: Diagnosis not present

## 2020-01-15 DIAGNOSIS — Z3A26 26 weeks gestation of pregnancy: Secondary | ICD-10-CM | POA: Diagnosis not present

## 2020-01-15 DIAGNOSIS — Z23 Encounter for immunization: Secondary | ICD-10-CM | POA: Diagnosis not present

## 2020-01-15 LAB — OB RESULTS CONSOLE HIV ANTIBODY (ROUTINE TESTING): HIV: NONREACTIVE

## 2020-01-28 ENCOUNTER — Other Ambulatory Visit: Payer: Self-pay

## 2020-01-28 ENCOUNTER — Emergency Department (HOSPITAL_COMMUNITY)
Admission: EM | Admit: 2020-01-28 | Discharge: 2020-01-29 | Disposition: A | Discharge status: Home / Self Care | Payer: BC Managed Care – PPO | Attending: Emergency Medicine | Admitting: Emergency Medicine

## 2020-01-28 ENCOUNTER — Encounter (HOSPITAL_COMMUNITY): Payer: Self-pay | Admitting: *Deleted

## 2020-01-28 DIAGNOSIS — R1011 Right upper quadrant pain: Secondary | ICD-10-CM | POA: Insufficient documentation

## 2020-01-28 DIAGNOSIS — Z3A28 28 weeks gestation of pregnancy: Secondary | ICD-10-CM | POA: Diagnosis not present

## 2020-01-28 DIAGNOSIS — Z5321 Procedure and treatment not carried out due to patient leaving prior to being seen by health care provider: Secondary | ICD-10-CM | POA: Diagnosis not present

## 2020-01-28 DIAGNOSIS — O26893 Other specified pregnancy related conditions, third trimester: Secondary | ICD-10-CM | POA: Insufficient documentation

## 2020-01-28 LAB — CBC
HCT: 35.1 % — ABNORMAL LOW (ref 36.0–46.0)
Hemoglobin: 11.6 g/dL — ABNORMAL LOW (ref 12.0–15.0)
MCH: 31.6 pg (ref 26.0–34.0)
MCHC: 33 g/dL (ref 30.0–36.0)
MCV: 95.6 fL (ref 80.0–100.0)
Platelets: 232 10*3/uL (ref 150–400)
RBC: 3.67 MIL/uL — ABNORMAL LOW (ref 3.87–5.11)
RDW: 12.7 % (ref 11.5–15.5)
WBC: 9.8 10*3/uL (ref 4.0–10.5)
nRBC: 0 % (ref 0.0–0.2)

## 2020-01-28 LAB — URINALYSIS, ROUTINE W REFLEX MICROSCOPIC
Bilirubin Urine: NEGATIVE
Glucose, UA: NEGATIVE mg/dL
Hgb urine dipstick: NEGATIVE
Ketones, ur: 5 mg/dL — AB
Leukocytes,Ua: NEGATIVE
Nitrite: NEGATIVE
Protein, ur: NEGATIVE mg/dL
Specific Gravity, Urine: 1.01 (ref 1.005–1.030)
pH: 6 (ref 5.0–8.0)

## 2020-01-28 MED ORDER — SODIUM CHLORIDE 0.9% FLUSH: 3.0000 mL | Freq: Once | INTRAVENOUS | Status: DC

## 2020-01-28 NOTE — ED Notes (Signed)
Pt is going to see her OBGYN in the morning.

## 2020-01-28 NOTE — ED Triage Notes (Signed)
Pt is approx [redacted] weeks pregnant, having intermittent RUQ pain. Pt thought she was constipated and took dulcolax with no relief. Called her ob and instructed to come here. Had episode of n/v today 5pm. No ob complaints and reports good fetal movement.

## 2020-01-29 LAB — LIPASE, BLOOD: Lipase: 24 U/L (ref 11–51)

## 2020-01-29 LAB — COMPREHENSIVE METABOLIC PANEL
ALT: 19 U/L (ref 0–44)
AST: 22 U/L (ref 15–41)
Albumin: 3.2 g/dL — ABNORMAL LOW (ref 3.5–5.0)
Alkaline Phosphatase: 56 U/L (ref 38–126)
Anion gap: 8 (ref 5–15)
BUN: 5 mg/dL — ABNORMAL LOW (ref 6–20)
CO2: 22 mmol/L (ref 22–32)
Calcium: 8.8 mg/dL — ABNORMAL LOW (ref 8.9–10.3)
Chloride: 107 mmol/L (ref 98–111)
Creatinine, Ser: 0.53 mg/dL (ref 0.44–1.00)
GFR calc Af Amer: >60 mL/min (ref >60)
GFR calc non Af Amer: >60 mL/min (ref >60)
Glucose, Bld: 79 mg/dL (ref 70–99)
Potassium: 3.5 mmol/L (ref 3.5–5.1)
Sodium: 137 mmol/L (ref 135–145)
Total Bilirubin: 0.8 mg/dL (ref 0.3–1.2)
Total Protein: 6.5 g/dL (ref 6.5–8.1)

## 2020-01-29 LAB — I-STAT BETA HCG BLOOD, ED (MC, WL, AP ONLY): I-stat hCG, quantitative: >2000 m[IU]/mL — ABNORMAL HIGH (ref <5)

## 2020-02-18 DIAGNOSIS — O26843 Uterine size-date discrepancy, third trimester: Secondary | ICD-10-CM | POA: Diagnosis not present

## 2020-02-18 DIAGNOSIS — Z3A31 31 weeks gestation of pregnancy: Secondary | ICD-10-CM | POA: Diagnosis not present

## 2020-03-04 DIAGNOSIS — N871 Moderate cervical dysplasia: Secondary | ICD-10-CM | POA: Diagnosis not present

## 2020-03-04 DIAGNOSIS — R87613 High grade squamous intraepithelial lesion on cytologic smear of cervix (HGSIL): Secondary | ICD-10-CM | POA: Diagnosis not present

## 2020-03-13 DIAGNOSIS — Z20822 Contact with and (suspected) exposure to covid-19: Secondary | ICD-10-CM | POA: Diagnosis not present

## 2020-03-19 DIAGNOSIS — Z3685 Encounter for antenatal screening for Streptococcus B: Secondary | ICD-10-CM | POA: Diagnosis not present

## 2020-03-19 DIAGNOSIS — O26893 Other specified pregnancy related conditions, third trimester: Secondary | ICD-10-CM | POA: Diagnosis not present

## 2020-03-19 DIAGNOSIS — Z3A35 35 weeks gestation of pregnancy: Secondary | ICD-10-CM | POA: Diagnosis not present

## 2020-03-19 LAB — OB RESULTS CONSOLE GBS: GBS: NEGATIVE

## 2020-03-31 ENCOUNTER — Inpatient Hospital Stay (HOSPITAL_COMMUNITY): Payer: BC Managed Care – PPO | Admitting: Anesthesiology

## 2020-03-31 ENCOUNTER — Other Ambulatory Visit: Payer: Self-pay

## 2020-03-31 ENCOUNTER — Encounter (HOSPITAL_COMMUNITY): Payer: Self-pay | Admitting: Obstetrics & Gynecology

## 2020-03-31 ENCOUNTER — Inpatient Hospital Stay (HOSPITAL_COMMUNITY)
Admission: AD | Admit: 2020-03-31 | Discharge: 2020-04-02 | DRG: 798 | Disposition: A | Discharge status: Home / Self Care | Payer: BC Managed Care – PPO | Attending: Obstetrics and Gynecology | Admitting: Obstetrics and Gynecology

## 2020-03-31 DIAGNOSIS — Z3A37 37 weeks gestation of pregnancy: Secondary | ICD-10-CM

## 2020-03-31 DIAGNOSIS — Z23 Encounter for immunization: Secondary | ICD-10-CM | POA: Diagnosis not present

## 2020-03-31 DIAGNOSIS — O9952 Diseases of the respiratory system complicating childbirth: Secondary | ICD-10-CM | POA: Diagnosis not present

## 2020-03-31 DIAGNOSIS — J45909 Unspecified asthma, uncomplicated: Secondary | ICD-10-CM | POA: Diagnosis not present

## 2020-03-31 DIAGNOSIS — O26893 Other specified pregnancy related conditions, third trimester: Secondary | ICD-10-CM | POA: Diagnosis not present

## 2020-03-31 DIAGNOSIS — Z6791 Unspecified blood type, Rh negative: Secondary | ICD-10-CM

## 2020-03-31 DIAGNOSIS — O99214 Obesity complicating childbirth: Secondary | ICD-10-CM | POA: Diagnosis present

## 2020-03-31 DIAGNOSIS — E669 Obesity, unspecified: Secondary | ICD-10-CM | POA: Diagnosis not present

## 2020-03-31 DIAGNOSIS — Z20822 Contact with and (suspected) exposure to covid-19: Secondary | ICD-10-CM | POA: Diagnosis not present

## 2020-03-31 DIAGNOSIS — O4292 Full-term premature rupture of membranes, unspecified as to length of time between rupture and onset of labor: Principal | ICD-10-CM | POA: Diagnosis present

## 2020-03-31 DIAGNOSIS — Z412 Encounter for routine and ritual male circumcision: Secondary | ICD-10-CM | POA: Diagnosis not present

## 2020-03-31 DIAGNOSIS — R061 Stridor: Secondary | ICD-10-CM | POA: Diagnosis not present

## 2020-03-31 LAB — CBC
HCT: 34 % — ABNORMAL LOW (ref 36.0–46.0)
Hemoglobin: 10.9 g/dL — ABNORMAL LOW (ref 12.0–15.0)
MCH: 29.3 pg (ref 26.0–34.0)
MCHC: 32.1 g/dL (ref 30.0–36.0)
MCV: 91.4 fL (ref 80.0–100.0)
Platelets: 237 10*3/uL (ref 150–400)
RBC: 3.72 MIL/uL — ABNORMAL LOW (ref 3.87–5.11)
RDW: 12.8 % (ref 11.5–15.5)
WBC: 8.3 10*3/uL (ref 4.0–10.5)
nRBC: 0 % (ref 0.0–0.2)

## 2020-03-31 LAB — TYPE AND SCREEN
ABO/RH(D): O NEG
Antibody Screen: POSITIVE

## 2020-03-31 LAB — SARS CORONAVIRUS 2 BY RT PCR (HOSPITAL ORDER, PERFORMED IN ~~LOC~~ HOSPITAL LAB): SARS Coronavirus 2: NEGATIVE

## 2020-03-31 MED ORDER — LACTATED RINGERS IV SOLN
500.0000 mL | Freq: Once | INTRAVENOUS | Status: AC
  Administered 2020-03-31: 500 mL via INTRAVENOUS

## 2020-03-31 MED ORDER — OXYCODONE HCL 5 MG PO TABS: 5.0000 mg | ORAL_TABLET | Freq: Once | ORAL | Status: DC | PRN

## 2020-03-31 MED ORDER — SOD CITRATE-CITRIC ACID 500-334 MG/5ML PO SOLN: 30.0000 mL | ORAL | Status: DC | PRN

## 2020-03-31 MED ORDER — TETANUS-DIPHTH-ACELL PERTUSSIS 5-2.5-18.5 LF-MCG/0.5 IM SUSP: 0.5000 mL | Freq: Once | INTRAMUSCULAR | Status: DC

## 2020-03-31 MED ORDER — OXYTOCIN BOLUS FROM INFUSION
333.0000 mL | Freq: Once | INTRAVENOUS | Status: AC
  Administered 2020-03-31: 333 mL via INTRAVENOUS

## 2020-03-31 MED ORDER — IBUPROFEN 600 MG PO TABS
600.0000 mg | ORAL_TABLET | Freq: Four times a day (QID) | ORAL | Status: DC
  Administered 2020-03-31 – 2020-04-02 (×8): 600 mg via ORAL
  Filled 2020-03-31 (×8): qty 1

## 2020-03-31 MED ORDER — ONDANSETRON HCL 4 MG/2ML IJ SOLN: 4.0000 mg | INTRAMUSCULAR | Status: DC | PRN

## 2020-03-31 MED ORDER — FENTANYL-BUPIVACAINE-NACL 0.5-0.125-0.9 MG/250ML-% EP SOLN: 12.0000 mL/h | EPIDURAL | Status: DC | PRN

## 2020-03-31 MED ORDER — PHENYLEPHRINE 40 MCG/ML (10ML) SYRINGE FOR IV PUSH (FOR BLOOD PRESSURE SUPPORT): 80.0000 ug | PREFILLED_SYRINGE | INTRAVENOUS | Status: DC | PRN

## 2020-03-31 MED ORDER — SENNOSIDES-DOCUSATE SODIUM 8.6-50 MG PO TABS
2.0000 | ORAL_TABLET | ORAL | Status: DC
  Administered 2020-04-01 (×2): 2 via ORAL
  Filled 2020-03-31 (×2): qty 2

## 2020-03-31 MED ORDER — SIMETHICONE 80 MG PO CHEW: 80.0000 mg | CHEWABLE_TABLET | ORAL | Status: DC | PRN

## 2020-03-31 MED ORDER — FLEET ENEMA 7-19 GM/118ML RE ENEM: 1.0000 | ENEMA | RECTAL | Status: DC | PRN

## 2020-03-31 MED ORDER — WITCH HAZEL-GLYCERIN EX PADS: 1.0000 "application " | MEDICATED_PAD | CUTANEOUS | Status: DC | PRN

## 2020-03-31 MED ORDER — FENTANYL-BUPIVACAINE-NACL 0.5-0.125-0.9 MG/250ML-% EP SOLN
EPIDURAL | Status: AC
  Filled 2020-03-31: qty 250

## 2020-03-31 MED ORDER — EPHEDRINE 5 MG/ML INJ: 10.0000 mg | INTRAVENOUS | Status: DC | PRN

## 2020-03-31 MED ORDER — LIDOCAINE HCL (PF) 1 % IJ SOLN: 30.0000 mL | INTRAMUSCULAR | Status: DC | PRN

## 2020-03-31 MED ORDER — DIPHENHYDRAMINE HCL 25 MG PO CAPS: 25.0000 mg | ORAL_CAPSULE | Freq: Four times a day (QID) | ORAL | Status: DC | PRN

## 2020-03-31 MED ORDER — FENTANYL CITRATE (PF) 100 MCG/2ML IJ SOLN: 50.0000 ug | Freq: Once | INTRAMUSCULAR | Status: DC

## 2020-03-31 MED ORDER — DIBUCAINE (PERIANAL) 1 % EX OINT: 1.0000 "application " | TOPICAL_OINTMENT | CUTANEOUS | Status: DC | PRN

## 2020-03-31 MED ORDER — ACETAMINOPHEN 325 MG PO TABS
650.0000 mg | ORAL_TABLET | ORAL | Status: DC | PRN
  Administered 2020-04-02: 650 mg via ORAL
  Filled 2020-03-31: qty 2

## 2020-03-31 MED ORDER — DIPHENHYDRAMINE HCL 50 MG/ML IJ SOLN: 12.5000 mg | INTRAMUSCULAR | Status: DC | PRN

## 2020-03-31 MED ORDER — OXYTOCIN-SODIUM CHLORIDE 30-0.9 UT/500ML-% IV SOLN
1.0000 m[IU]/min | INTRAVENOUS | Status: DC
  Administered 2020-03-31: 2 m[IU]/min via INTRAVENOUS
  Filled 2020-03-31: qty 500

## 2020-03-31 MED ORDER — BENZOCAINE-MENTHOL 20-0.5 % EX AERO: 1.0000 "application " | INHALATION_SPRAY | CUTANEOUS | Status: DC | PRN

## 2020-03-31 MED ORDER — LIDOCAINE HCL (PF) 1 % IJ SOLN
INTRAMUSCULAR | Status: DC | PRN
  Administered 2020-03-31: 4 mL via EPIDURAL
  Administered 2020-03-31: 5 mL via EPIDURAL

## 2020-03-31 MED ORDER — ZOLPIDEM TARTRATE 5 MG PO TABS: 5.0000 mg | ORAL_TABLET | Freq: Every evening | ORAL | Status: DC | PRN

## 2020-03-31 MED ORDER — OXYCODONE-ACETAMINOPHEN 5-325 MG PO TABS: 2.0000 | ORAL_TABLET | ORAL | Status: DC | PRN

## 2020-03-31 MED ORDER — PRENATAL MULTIVITAMIN CH
1.0000 | ORAL_TABLET | Freq: Every day | ORAL | Status: DC
  Administered 2020-03-31 – 2020-04-01 (×2): 1 via ORAL
  Filled 2020-03-31 (×2): qty 1

## 2020-03-31 MED ORDER — LACTATED RINGERS IV SOLN: 500.0000 mL | INTRAVENOUS | Status: DC | PRN

## 2020-03-31 MED ORDER — OXYCODONE-ACETAMINOPHEN 5-325 MG PO TABS: 1.0000 | ORAL_TABLET | ORAL | Status: DC | PRN

## 2020-03-31 MED ORDER — LACTATED RINGERS IV SOLN: INTRAVENOUS | Status: DC

## 2020-03-31 MED ORDER — TERBUTALINE SULFATE 1 MG/ML IJ SOLN: 0.2500 mg | Freq: Once | INTRAMUSCULAR | Status: DC | PRN

## 2020-03-31 MED ORDER — ACETAMINOPHEN 325 MG PO TABS: 650.0000 mg | ORAL_TABLET | ORAL | Status: DC | PRN

## 2020-03-31 MED ORDER — OXYTOCIN-SODIUM CHLORIDE 30-0.9 UT/500ML-% IV SOLN: 2.5000 [IU]/h | INTRAVENOUS | Status: DC

## 2020-03-31 MED ORDER — ONDANSETRON HCL 4 MG/2ML IJ SOLN: 4.0000 mg | Freq: Four times a day (QID) | INTRAMUSCULAR | Status: DC | PRN

## 2020-03-31 MED ORDER — ONDANSETRON HCL 4 MG PO TABS: 4.0000 mg | ORAL_TABLET | ORAL | Status: DC | PRN

## 2020-03-31 MED ORDER — COCONUT OIL OIL: 1.0000 "application " | TOPICAL_OIL | Status: DC | PRN

## 2020-03-31 MED ORDER — FENTANYL CITRATE (PF) 2500 MCG/50ML IJ SOLN
INTRAMUSCULAR | Status: DC | PRN
Start: 2020-03-31 — End: 2020-04-02
  Administered 2020-03-31: 12.5 mL/h via EPIDURAL

## 2020-03-31 NOTE — Anesthesia Postprocedure Evaluation (Signed)
Anesthesia Post Note  Patient: Heather Holden  Procedure(s) Performed: AN AD HOC LABOR EPIDURAL     Patient location during evaluation: Mother Baby Anesthesia Type: Epidural Level of consciousness: awake and alert and oriented Pain management: satisfactory to patient Vital Signs Assessment: post-procedure vital signs reviewed and stable Respiratory status: respiratory function stable Cardiovascular status: stable Postop Assessment: no headache, no backache, epidural receding, patient able to bend at knees, no signs of nausea or vomiting, adequate PO intake and able to ambulate Anesthetic complications: no   No complications documented.  Last Vitals:  Vitals:   03/31/20 1145 03/31/20 1245  BP: 115/61 117/69  Pulse: 63 70  Resp: 18 18  Temp: 37.1 C 37.6 C  SpO2: 100% 100%    Last Pain:  Vitals:   03/31/20 1400  TempSrc:   PainSc: 0-No pain   Pain Goal:                   Nishtha Raider

## 2020-03-31 NOTE — Anesthesia Procedure Notes (Signed)

## 2020-03-31 NOTE — H&P (Signed)
Heather Holden is a 29 y.o. female G2P0010 at [redacted]w[redacted]d presenting for PROM.  She was started on pitocin on arrival to L&D and has progressed well; comfortable with CLEA.  Antepartum course complicated by Ehlers-Danlos; possible hypermobile type.  S/P MFM consult.  S/P normal maternal ECHO.  Rh negative.  GBS negative.  Rubella NI.  OB History    Gravida  2   Para      Term      Preterm      AB  1   Living  0     SAB      TAB  1   Ectopic      Multiple      Live Births             Past Medical History:  Diagnosis Date  . ADD (attention deficit disorder)   . Asthma    Childhood  . Ehlers-Danlos disease   . History of cardiac arrhythmia    Not actually diagnosed   Past Surgical History:  Procedure Laterality Date  . HAND SURGERY    . WISDOM TOOTH EXTRACTION    . WRIST SURGERY     Family History: family history includes Heart disease in her mother. She was adopted. Social History:  reports that she has never smoked. She has never used smokeless tobacco. She reports previous alcohol use. She reports that she does not use drugs.     Maternal Diabetes: No Genetic Screening: Normal Maternal Ultrasounds/Referrals: Normal Fetal Ultrasounds or other Referrals:  Referred to Materal Fetal Medicine  Maternal Substance Abuse:  No Significant Maternal Medications:  None Significant Maternal Lab Results:  Group B Strep negative and Rh negative Other Comments:  None  Review of Systems Maternal Medical History:  Reason for admission: Rupture of membranes.   Contractions: Onset was 3-5 hours ago.   Frequency: regular.   Perceived severity is moderate.    Fetal activity: Perceived fetal activity is normal.   Last perceived fetal movement was within the past hour.    Prenatal complications: no prenatal complications Prenatal Complications - Diabetes: none.    Dilation: 10 Effacement (%): 100 Station: 0 Exam by:: Otelia Santee, RNC Blood pressure 94/82, pulse 84,  temperature 98 F (36.7 C), temperature source Oral, resp. rate 18, height 5\' 9"  (1.753 m), weight 93.9 kg, last menstrual period 07/16/2019, SpO2 100 %. Maternal Exam:  Uterine Assessment: Contraction strength is moderate.  Contraction frequency is regular.   Abdomen: Patient reports no abdominal tenderness. Fundal height is c/w dates.   Estimated fetal weight is 7#.       Fetal Exam Fetal Monitor Review: Baseline rate: 145.  Variability: moderate (6-25 bpm).   Pattern: accelerations present and no decelerations.    Fetal State Assessment: Category I - tracings are normal.     Physical Exam Constitutional:      Appearance: Normal appearance.  HENT:     Head: Normocephalic and atraumatic.  Pulmonary:     Effort: Pulmonary effort is normal.  Abdominal:     Palpations: Abdomen is soft.  Musculoskeletal:     Cervical back: Normal range of motion.  Skin:    General: Skin is warm and dry.  Neurological:     Mental Status: She is alert.  Psychiatric:        Mood and Affect: Mood normal.        Behavior: Behavior normal.     Prenatal labs: ABO, Rh: --/--/O NEG (09/20 0110) Antibody: POS (09/20  0110) Rubella: Nonimmune (03/11 0000) RPR:   NR HBsAg: Negative (03/11 0000)  HIV: Non-reactive (07/06 0000)  GBS: Negative/-- (09/08 0000)   Assessment/Plan: 29yo G2P0010 at [redacted]w[redacted]d with PROM -Anticipate NSVD soon -EDS-increased PPH risk; uterotonics in room   Mitchel Honour 03/31/2020, 7:36 AM

## 2020-03-31 NOTE — Anesthesia Preprocedure Evaluation (Addendum)
Anesthesia Evaluation  Patient identified by MRN, date of birth, ID band Patient awake    Reviewed: Allergy & Precautions, Patient's Chart, lab work & pertinent test results  Airway Mallampati: II  TM Distance: >3 FB Neck ROM: Full    Dental no notable dental hx. (+) Teeth Intact   Pulmonary asthma ,    Pulmonary exam normal        Cardiovascular Normal cardiovascular exam  Hx/o Cardiac Arrhythmia  Echo 12/17/19 1. Left ventricular ejection fraction, by estimation, is 60 to 65%. The left ventricle has normal function. The left ventricle has no regional wall motion abnormalities. Left ventricular diastolic parameters were normal.  2. Right ventricular systolic function is normal. The right ventricular size is normal.  3. The mitral valve is normal in structure. No evidence of mitral valve regurgitation. No evidence of mitral stenosis.  4. The aortic valve is normal in structure. Aortic valve regurgitation is not visualized. No aortic stenosis is present.    Neuro/Psych PSYCHIATRIC DISORDERS ADHDnegative neurological ROS     GI/Hepatic negative GI ROS, Neg liver ROS,   Endo/Other  Obesity  Renal/GU negative Renal ROS  negative genitourinary   Musculoskeletal Ehler's Danlos Disease Type C   Abdominal (+) + obese,   Peds  Hematology  (+) anemia ,   Anesthesia Other Findings   Reproductive/Obstetrics (+) Pregnancy                            Anesthesia Physical Anesthesia Plan  ASA: II  Anesthesia Plan: Epidural   Post-op Pain Management:    Induction:   PONV Risk Score and Plan:   Airway Management Planned: Natural Airway  Additional Equipment:   Intra-op Plan:   Post-operative Plan:   Informed Consent: I have reviewed the patients History and Physical, chart, labs and discussed the procedure including the risks, benefits and alternatives for the proposed anesthesia with the  patient or authorized representative who has indicated his/her understanding and acceptance.       Plan Discussed with: Anesthesiologist  Anesthesia Plan Comments:         Anesthesia Quick Evaluation

## 2020-03-31 NOTE — Lactation Note (Signed)
This note was copied from a baby's chart. Lactation Consultation Note  Patient Name: Boy Aracelie Addis [id number] Date: 03/31/2020   Baby boy Tennyson now 6 hours old. Baby Colombe born at [redacted] weeks gestation. Mom reports she has no milk.  Baby Trang trying to breastfeed on arrival. Mom reports breast growth in pregnancy but no color changes or tenderness.  Mom with wide spaced breasts. Mom has no breastpump yet at home.  Assisted with latching him in cross cradle on moms right breast. He latches well and will take a few sucks and come off.  He comes off and on. Assisted with hand expression and spoon feeding.  Able to hand express small drops of colostrum.  Showed dad how to do it and he did massage and hand expression and gave baby boy Gimbel a few drops from a spoon.  He came off and would not relatch.  So gave him to dad while initiated pumping with DEBP with mom. Mom had been pumping about 2 minutes when mother in law came in and mom took flanges off.   Urged parents to feed on cue and at least 8-12 times day. Urged parents to do some massage and hand expression if no cuing.  Reviewed Breastfeeding Resource List with parents  Urged to call lactation as needed.   Verle Wheeling Michaelle Copas 03/31/2020, 3:53 PM

## 2020-03-31 NOTE — MAU Note (Signed)
Pt reports to MAU stating she was on the couch and had a small gush of fluid and has been trickling ever since. Pt denies vaginal bleeding. +FM. Pt denies contractions but reports some pressure.

## 2020-04-01 LAB — CBC
HCT: 30.1 % — ABNORMAL LOW (ref 36.0–46.0)
Hemoglobin: 9.6 g/dL — ABNORMAL LOW (ref 12.0–15.0)
MCH: 28.9 pg (ref 26.0–34.0)
MCHC: 31.9 g/dL (ref 30.0–36.0)
MCV: 90.7 fL (ref 80.0–100.0)
Platelets: 203 10*3/uL (ref 150–400)
RBC: 3.32 MIL/uL — ABNORMAL LOW (ref 3.87–5.11)
RDW: 13 % (ref 11.5–15.5)
WBC: 12.7 10*3/uL — ABNORMAL HIGH (ref 4.0–10.5)
nRBC: 0 % (ref 0.0–0.2)

## 2020-04-01 LAB — RPR: RPR Ser Ql: NONREACTIVE

## 2020-04-01 MED ORDER — RHO D IMMUNE GLOBULIN 1500 UNIT/2ML IJ SOSY
300.0000 ug | PREFILLED_SYRINGE | Freq: Once | INTRAMUSCULAR | Status: AC
  Administered 2020-04-01: 300 ug via INTRAVENOUS
  Filled 2020-04-01: qty 2

## 2020-04-01 NOTE — Progress Notes (Signed)
Post Partum Day 1 Subjective: no complaints, up ad lib, voiding and tolerating PO  Objective: Blood pressure (!) 98/53, pulse (!) 55, temperature 98 F (36.7 C), temperature source Oral, resp. rate 18, height 5\' 9"  (1.753 m), weight 93.9 kg, last menstrual period 07/16/2019, SpO2 100 %, unknown if currently breastfeeding.  Physical Exam:  General: alert, cooperative, appears stated age and no distress Lochia: appropriate Uterine Fundus: firm Incision: healing well DVT Evaluation: No evidence of DVT seen on physical exam.  Recent Labs    03/31/20 0110 04/01/20 0625  HGB 10.9* 9.6*  HCT 34.0* 30.1*    Assessment/Plan: Plan for discharge tomorrow, Breastfeeding and Circumcision prior to discharge   LOS: 1 day   04/03/20 04/01/2020, 11:20 AM

## 2020-04-02 LAB — RH IG WORKUP (INCLUDES ABO/RH)
ABO/RH(D): O NEG
Fetal Screen: NEGATIVE
Gestational Age(Wks): 37
Unit division: 0

## 2020-04-02 MED ORDER — MEASLES, MUMPS & RUBELLA VAC IJ SOLR
0.5000 mL | Freq: Once | INTRAMUSCULAR | Status: AC
  Administered 2020-04-02: 0.5 mL via SUBCUTANEOUS

## 2020-04-02 NOTE — Lactation Note (Signed)
This note was copied from a baby's chart. Lactation Consultation Note  Patient Name: Heather Holden [id number] Date: 04/02/2020 Reason for consult: Follow-up assessment;Early term 56-38.6wks   Baby 56 hours old.  37 weeks.  Baby is breastfeeding and being supplemented with formula. Mother plans to buy DEBP.  Discussed supplementing with her own milk. Increase supplementation volume per day of life and as baby desires. Feed on demand with cues.  Goal 8-12+ times per day after first 24 hrs.  Place baby STS if not cueing.  Wake baby for feedings if needed. Reviewed engorgement care and monitoring voids/stools.    Maternal Data    Feeding Feeding Type: Breast Fed  LATCH Score Latch: Grasps breast easily, tongue down, lips flanged, rhythmical sucking.  Audible Swallowing: Spontaneous and intermittent  Type of Nipple: Everted at rest and after stimulation  Comfort (Breast/Nipple): Soft / non-tender  Hold (Positioning): No assistance needed to correctly position infant at breast.  LATCH Score: 10  Interventions Interventions: Breast feeding basics reviewed  Lactation Tools Discussed/Used     Consult Status Consult Status: Complete Date: 04/02/20    Dahlia Byes [facility] 04/02/2020, 10:42 AM

## 2020-04-02 NOTE — Discharge Summary (Signed)
Postpartum Discharge Summary  Date of Service April 01, 2020     Patient Name: Heather Holden DOB: [birth year] MRN: [id number]  Date of admission: 03/31/2020 Delivery date:03/31/2020  Delivering provider: Eyvonne Mechanic A  Date of discharge: 04/02/2020  Admitting diagnosis: Labor and delivery indication for care or intervention [O75.9] Intrauterine pregnancy: [redacted]w[redacted]d    Secondary diagnosis:  Active Problems:   Labor and delivery indication for care or intervention  Additional problems: none    Discharge diagnosis: Term Pregnancy Delivered                                              Post partum procedures:none Augmentation: Pitocin Complications: None  Hospital course: Onset of Labor With Vaginal Delivery      29y.o. yo G2P1011 at 377w0das admitted in Latent Labor on 03/31/2020. Patient had an uncomplicated labor course as follows:  Membrane Rupture Time/Date: 10:40 PM ,03/30/2020   Delivery Method:Vaginal, Spontaneous  Episiotomy: None  Lacerations:  1st degree;Periurethral  Patient had an uncomplicated postpartum course.  She is ambulating, tolerating a regular diet, passing flatus, and urinating well. Patient is discharged home in stable condition on 04/02/20.  Newborn Data: Birth date:03/31/2020  Birth time:9:27 AM  Gender:Female  Living status:Living  Apgars:8 ,9  Weight:2965 g   Magnesium Sulfate received: No BMZ received: No Rhophylac:No MMR:No T-DaP:Given prenatally Flu: No Transfusion:No  Physical exam  Vitals:   04/01/20 0500 04/01/20 1357 04/01/20 2120 04/02/20 0523  BP: (!) 98/53 111/69 109/62 100/60  Pulse: (!) 55 65 (!) 52 64  Resp: 18 18 17 18   Temp: 98 F (36.7 C) 97.8 F (36.6 C) 98.1 F (36.7 C) 97.9 F (36.6 C)  TempSrc: Oral Oral Oral   SpO2: 100%  100% 100%  Weight:      Height:       General: alert Lochia: appropriate Uterine Fundus: firm Incision: Healing well with no significant drainage DVT Evaluation: No evidence of DVT  seen on physical exam. Labs: Lab Results  Component Value Date   WBC 12.7 (H) 04/01/2020   HGB 9.6 (L) 04/01/2020   HCT 30.1 (L) 04/01/2020   MCV 90.7 04/01/2020   PLT 203 04/01/2020   CMP Latest Ref Rng & Units 01/28/2020  Glucose 70 - 99 mg/dL 79  BUN 6 - 20 mg/dL 5(L)  Creatinine 0.44 - 1.00 mg/dL 0.53  Sodium 135 - 145 mmol/L 137  Potassium 3.5 - 5.1 mmol/L 3.5  Chloride 98 - 111 mmol/L 107  CO2 22 - 32 mmol/L 22  Calcium 8.9 - 10.3 mg/dL 8.8(L)  Total Protein 6.5 - 8.1 g/dL 6.5  Total Bilirubin 0.3 - 1.2 mg/dL 0.8  Alkaline Phos 38 - 126 U/L 56  AST 15 - 41 U/L 22  ALT 0 - 44 U/L 19   Edinburgh Score: Edinburgh Postnatal Depression Scale Screening Tool 04/01/2020  I have been able to laugh and see the funny side of things. 0  I have looked forward with enjoyment to things. 0  I have blamed myself unnecessarily when things went wrong. 1  I have been anxious or worried for no good reason. 1  I have felt scared or panicky for no good reason. 0  Things have been getting on top of me. 0  I have been so unhappy that I have had difficulty sleeping. 1  I have felt sad or miserable. 0  I have been so unhappy that I have been crying. 0  The thought of harming myself has occurred to me. 0  Edinburgh Postnatal Depression Scale Total 3      After visit meds:  Allergies as of 04/02/2020   No Known Allergies     Medication List    TAKE these medications   PRENATAL VITAMINS PO Take by mouth.        Discharge home in stable condition Infant Feeding: Breast Infant Disposition:home with mother Discharge instruction: per After Visit Summary and Postpartum booklet. Activity: Advance as tolerated. Pelvic rest for 6 weeks.  Diet: routine diet Anticipated Birth Control: Unsure Postpartum Appointment:6 weeks Additional Postpartum F/U: not applicable Future Appointments:No future appointments. Follow up Visit:      04/02/2020 Cyril Mourning, MD

## 2020-04-21 ENCOUNTER — Inpatient Hospital Stay (HOSPITAL_COMMUNITY): Admit: 2020-04-21 | Payer: Self-pay

## 2020-05-12 DIAGNOSIS — Z1389 Encounter for screening for other disorder: Secondary | ICD-10-CM | POA: Diagnosis not present

## 2020-05-26 DIAGNOSIS — Z01411 Encounter for gynecological examination (general) (routine) with abnormal findings: Secondary | ICD-10-CM | POA: Diagnosis not present

## 2020-05-26 DIAGNOSIS — Z3202 Encounter for pregnancy test, result negative: Secondary | ICD-10-CM | POA: Diagnosis not present

## 2020-05-26 DIAGNOSIS — N871 Moderate cervical dysplasia: Secondary | ICD-10-CM | POA: Diagnosis not present

## 2020-05-26 DIAGNOSIS — R87613 High grade squamous intraepithelial lesion on cytologic smear of cervix (HGSIL): Secondary | ICD-10-CM | POA: Diagnosis not present

## 2020-07-20 NOTE — Progress Notes (Signed)
Cardiology Office Note:   Date:  07/21/2020  NAME:  Heather Holden    MRN: [id number] DOB:  06-May-1991   PCP:  Ranae Pila, MD  Cardiologist:  No primary care provider on file.   Referring MD: Ranae Pila, *   Chief Complaint  Patient presents with   Follow-up        History of Present Illness:   Heather Holden is a 30 y.o. female with a hx of ehlers danlos, POTS, family history of thoracic aortic aneurysm (mother) who presents for follow-up. Was [redacted] weeks pregnant last year and seen for aortic screening. Aorta normal.  She reports her delivery went well.  She has a baby boy.  No issues with that.  She is recovering well.  She is back at work.  Blood pressure well controlled.  We did go over the results of her echocardiogram which shows her aorta is normal size.  She did inquire more about her mother.  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  She has not had surgery.  We did discuss that her version of Ehlers-Danlos is less likely to be associated with aortic aneurysms.  I recommend she repeat an echocardiogram in roughly 5 years to make sure there is no change.  She denies any symptoms of her pots.  This is well controlled.  She maintains adequate hydration to treat this.  Problem List 1. 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 POTS  Past Medical History: Past Medical History:  Diagnosis Date   ADD (attention deficit disorder)    Asthma    Childhood   Ehlers-Danlos disease    History of cardiac arrhythmia    Not actually diagnosed    Past Surgical History: Past Surgical History:  Procedure Laterality Date   HAND SURGERY     WISDOM TOOTH EXTRACTION     WRIST SURGERY      Current Medications: Current Meds  Medication Sig   VYVANSE 20 MG capsule Take 20 mg by mouth every morning.     Allergies:    Patient has no known allergies.   Social  History: Social History   Socioeconomic History   Marital status: Married    Spouse name: Not on file   Number of children: Not on file   Years of education: Not on file   Highest education level: Not on file  Occupational History   Not on file  Tobacco Use   Smoking status: Never Smoker   Smokeless tobacco: Never Used  Vaping Use   Vaping Use: Never used  Substance and Sexual Activity   Alcohol use: Not Currently   Drug use: No   Sexual activity: Not on file  Other Topics Concern   Not on file  Social History Narrative   Not on file   Social Determinants of Health   Financial Resource Strain: Not on file  Food Insecurity: Not on file  Transportation Needs: Not on file  Physical Activity: Not on file  Stress: Not on file  Social Connections: Not on file     Family History: The patient's family history includes Aortic aneurysm in her mother; Heart disease in her mother. She was adopted.  ROS:   All other ROS reviewed and negative. Pertinent positives noted in the HPI.     EKGs/Labs/Other Studies Reviewed:   The following studies were personally reviewed by me today:  TTE 12/17/2019  1. Left ventricular ejection fraction, by estimation, is 60 to 65%. The  left ventricle has normal function. The left ventricle has no regional  wall motion abnormalities. Left ventricular diastolic parameters were  normal.  2. Right ventricular systolic function is normal. The right ventricular  size is normal.  3. The mitral valve is normal in structure. No evidence of mitral valve  regurgitation. No evidence of mitral stenosis.  4. The aortic valve is normal in structure. Aortic valve regurgitation is  not visualized. No aortic stenosis is present.   Recent Labs: 01/28/2020: ALT 19; BUN 5; Creatinine, Ser 0.53; Potassium 3.5; Sodium 137 04/01/2020: Hemoglobin 9.6; Platelets 203   Recent Lipid Panel No results found for: CHOL, TRIG, HDL, CHOLHDL, VLDL, LDLCALC,  LDLDIRECT  Physical Exam:   VS:  BP 115/73    Pulse 75    Temp 98.2 F (36.8 C)    Ht 5\' 9"  (1.753 m)    Wt 181 lb 12.8 oz (82.5 kg)    SpO2 99%    BMI 26.85 kg/m    Wt Readings from Last 3 Encounters:  07/21/20 181 lb 12.8 oz (82.5 kg)  03/31/20 207 lb (93.9 kg)  11/19/19 175 lb (79.4 kg)    General: Well nourished, well developed, in no acute distress Head: Atraumatic, normal size  Eyes: PEERLA, EOMI  Neck: Supple, no JVD Endocrine: No thryomegaly Cardiac: Normal S1, S2; RRR; no murmurs, rubs, or gallops Lungs: Clear to auscultation bilaterally, no wheezing, rhonchi or rales  Abd: Soft, nontender, no hepatomegaly  Ext: No edema, pulses 2+ Musculoskeletal: No deformities, BUE and BLE strength normal and equal Skin: Warm and dry, no rashes   Neuro: Alert and oriented to person, place, time, and situation, CNII-XII grossly intact, no focal deficits  Psych: Normal mood and affect   ASSESSMENT:   Heather Holden is a 30 y.o. female who presents for the following: 1. POTS (postural orthostatic tachycardia syndrome)   2. Ehlers-Danlos disease     PLAN:   1. POTS (postural orthostatic tachycardia syndrome) -No symptoms.  Managed with hydration and exercise.  2. Ehlers-Danlos disease -She has hypermobile Ehlers-Danlos syndrome.  Recent echocardiogram shows normal size of her aorta.  Her mother does have a history of an aortic root aneurysm that is being followed.  She has no evidence of this.  I would recommend she see me every 2 years.  I would recommend to repeat imaging of her aorta in 5 years.  Hopefully there will be no change.  She should continue with periodic surveillance given her family history.  However I see no need for cross-sectional imaging given her lack of any dilation on echocardiogram.  Disposition: Return in about 2 years (around 07/21/2022).  Medication Adjustments/Labs and Tests Ordered: Current medicines are reviewed at length with the patient today.  Concerns  regarding medicines are outlined above.  No orders of the defined types were placed in this encounter.  No orders of the defined types were placed in this encounter.   Patient Instructions  Medication Instructions:  Your physician recommends that you continue on your current medications as directed. Please refer to the Current Medication list given to you today.  *If you need a refill on your cardiac medications before your next appointment, please call your pharmacy*  Follow-Up: At [facility], you and your health needs are our priority.  As part of our continuing mission to provide you with exceptional heart care, we have created designated Provider Care Teams.  These Care Teams include your primary Cardiologist (physician) and Advanced Practice Providers (APPs -  Physician Assistants and Nurse Practitioners) who all work together to provide you with the care you need, when you need it.  We recommend signing up for the patient portal called "MyChart".  Sign up information is provided on this After Visit Summary.  MyChart is used to connect with patients for Virtual Visits (Telemedicine).  Patients are able to view lab/test results, encounter notes, upcoming appointments, etc.  Non-urgent messages can be sent to your provider as well.   To learn more about what you can do with MyChart, go to [email or web address].    Your next appointment:   2 year(s)  The format for your next appointment:   In Person  Provider:   You may see Dr. Flora Lipps or one of the following Advanced Practice Providers on your designated Care Team:    Azalee Course, PA-C  Micah Flesher, [place] or   Judy Pimple, [place]    Other Instructions      Time Spent with Patient: I have spent a total of 25 minutes with patient reviewing hospital notes, telemetry, EKGs, labs and examining the patient as well as establishing an assessment and plan that was discussed with the patient.  > 50% of time was spent in direct  patient care.  Signed, Lenna Gilford. Flora Lipps, MD [facility]  [address], [address] [place], [place] [address] [phone]  07/21/2020 11:20 AM

## 2020-07-21 ENCOUNTER — Other Ambulatory Visit: Payer: Self-pay

## 2020-07-21 ENCOUNTER — Ambulatory Visit: Payer: BC Managed Care – PPO | Admitting: Cardiovascular Disease

## 2020-07-21 ENCOUNTER — Encounter: Payer: Self-pay | Admitting: Cardiovascular Disease

## 2020-07-21 VITALS — BP 115/73 | HR 75 | Temp 98.2°F | Ht 69.0 in | Wt 181.8 lb

## 2020-07-21 DIAGNOSIS — I498 Other specified cardiac arrhythmias: Secondary | ICD-10-CM

## 2020-07-21 DIAGNOSIS — Q796 Ehlers-Danlos syndrome, unspecified: Secondary | ICD-10-CM | POA: Diagnosis not present

## 2020-07-21 DIAGNOSIS — G90A Postural orthostatic tachycardia syndrome (POTS): Secondary | ICD-10-CM

## 2020-07-21 NOTE — Patient Instructions (Signed)
Medication Instructions:  Your physician recommends that you continue on your current medications as directed. Please refer to the Current Medication list given to you today.  *If you need a refill on your cardiac medications before your next appointment, please call your pharmacy*  Follow-Up: At [facility], you and your health needs are our priority.  As part of our continuing mission to provide you with exceptional heart care, we have created designated Provider Care Teams.  These Care Teams include your primary Cardiologist (physician) and Advanced Practice Providers (APPs -  Physician Assistants and Nurse Practitioners) who all work together to provide you with the care you need, when you need it.  We recommend signing up for the patient portal called "MyChart".  Sign up information is provided on this After Visit Summary.  MyChart is used to connect with patients for Virtual Visits (Telemedicine).  Patients are able to view lab/test results, encounter notes, upcoming appointments, etc.  Non-urgent messages can be sent to your provider as well.   To learn more about what you can do with MyChart, go to [email or web address].    Your next appointment:   2 year(s)  The format for your next appointment:   In Person  Provider:   You may see Dr. Flora Lipps or one of the following Advanced Practice Providers on your designated Care Team:    Azalee Course, PA-C  Micah Flesher, [place] or   Judy Pimple, [place]    Other Instructions
